# Patient Record
Sex: Male | Born: 1990 | Race: White | Hispanic: No | Marital: Single | Smoking: Never smoker
Health system: Southern US, Community
[De-identification: ages and names within clinical notes are randomized; demographics above are authoritative.]

## PROBLEM LIST (undated history)

## (undated) HISTORY — PX: WISDOM TOOTH EXTRACTION: SHX21

---
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
## 2012-08-05 ENCOUNTER — Ambulatory Visit (INDEPENDENT_AMBULATORY_CARE_PROVIDER_SITE_OTHER): Payer: BC Managed Care – PPO | Admitting: Family Medicine

## 2012-08-05 ENCOUNTER — Encounter: Payer: Self-pay | Admitting: Family Medicine

## 2012-08-05 VITALS — BP 104/76 | Wt 150.2 lb

## 2012-08-05 DIAGNOSIS — J329 Chronic sinusitis, unspecified: Secondary | ICD-10-CM | POA: Insufficient documentation

## 2012-08-05 DIAGNOSIS — J322 Chronic ethmoidal sinusitis: Secondary | ICD-10-CM

## 2012-08-05 MED ORDER — CLINDAMYCIN HCL 150 MG PO CAPS: 150.0000 mg | ORAL_CAPSULE | Freq: Four times a day (QID) | ORAL | Status: AC

## 2012-08-05 NOTE — Progress Notes (Signed)
  Subjective:    Patient ID: Steve Lucas, male    DOB: 09-28-1990, 22 y.o.   MRN: [id number]  Sinusitis This is a recurrent problem. The current episode started in the past 7 days. The problem has been gradually worsening since onset. His pain is at a severity of 3/10. Associated symptoms include coughing, headaches and a hoarse voice. Pertinent negatives include no shortness of breath. Past treatments include oral decongestants. The treatment provided mild relief.  Headache  Associated symptoms include coughing.  Sore Throat  Associated symptoms include coughing, headaches and a hoarse voice. Pertinent negatives include no shortness of breath.      Review of Systems  Constitutional: Negative for activity change and fatigue.  HENT: Positive for hoarse voice.   Respiratory: Positive for cough and wheezing. Negative for apnea and shortness of breath.   Neurological: Positive for headaches.  All other systems reviewed and are negative.       Objective:   Physical Exam  Vitals reviewed. Constitutional: He appears well-developed.  HENT:  Head: Normocephalic and atraumatic.  Nasal congestion evident. Frontal tenderness. Maxillary tenderness.  Eyes: Conjunctivae are normal. Pupils are equal, round, and reactive to light.  Neck: Normal range of motion. No thyromegaly present.  Cardiovascular: Normal rate and regular rhythm.   Pulmonary/Chest: Effort normal. He has no wheezes.  Abdominal: Soft.          Assessment & Plan:  Impression acute flare of sinusitis. Minimally responsive to Levaquin. Plan clindamycin 150 4 times a day. As per orders.

## 2012-08-05 NOTE — Patient Instructions (Signed)
Use probioticdaily while on antibiotics.

## 2012-08-16 ENCOUNTER — Telehealth: Payer: Self-pay | Admitting: *Deleted

## 2012-08-16 ENCOUNTER — Other Ambulatory Visit: Payer: Self-pay | Admitting: *Deleted

## 2012-08-16 DIAGNOSIS — J329 Chronic sinusitis, unspecified: Secondary | ICD-10-CM

## 2012-08-16 NOTE — Telephone Encounter (Signed)
i am not familiar with Dr.Clinger. Dr.Teoh from [facility] or Dr.Rosen are ones I am more familiar with and therefore trust more.we can change appt if family desires

## 2012-08-16 NOTE — Telephone Encounter (Signed)
Has appointment with Dr. Imogene Burn) next week- wanted Dr. Gerilyn Pilgrim but she no loner practicing-concerned if he is highly rec by you or if it was just made by that office. Concerned because mother feels he will need surgery and wants someone you highly recommend.

## 2012-08-21 ENCOUNTER — Telehealth: Payer: Self-pay | Admitting: Family Medicine

## 2012-08-21 ENCOUNTER — Other Ambulatory Visit: Payer: Self-pay | Admitting: Family Medicine

## 2012-08-21 MED ORDER — AMOXICILLIN-POT CLAVULANATE ER 1000-62.5 MG PO TB12: 2.0000 | ORAL_TABLET | Freq: Two times a day (BID) | ORAL | Status: AC

## 2012-08-21 NOTE — Telephone Encounter (Signed)
i spoke with Dad, Augmentin XR 1,000 mg 2 tab bid called into [facility]. Dad wondering when referal with Dr.Rosen is? PLZ CALL HIM WITH UPDATE. THANKS  [phone]

## 2012-08-21 NOTE — Telephone Encounter (Signed)
Patients father is concerned about his ongoing sinus infection and would like to know if he should be on a low dose sinus infection until he sees the ENT? [facility]

## 2012-08-22 NOTE — Telephone Encounter (Signed)
cld dad, gave info, also notified we MUST have signed release from pt to discuss his care cause he's 22 years old

## 2012-10-03 ENCOUNTER — Ambulatory Visit (INDEPENDENT_AMBULATORY_CARE_PROVIDER_SITE_OTHER): Payer: BC Managed Care – PPO | Admitting: Family Medicine

## 2012-10-03 ENCOUNTER — Encounter: Payer: Self-pay | Admitting: Family Medicine

## 2012-10-03 VITALS — BP 110/72 | Temp 98.6°F | Wt 145.0 lb

## 2012-10-03 DIAGNOSIS — R197 Diarrhea, unspecified: Secondary | ICD-10-CM

## 2012-10-03 MED ORDER — DIPHENOXYLATE-ATROPINE 2.5-0.025 MG PO TABS: 1.0000 | ORAL_TABLET | Freq: Four times a day (QID) | ORAL | Status: DC | PRN

## 2012-10-03 MED ORDER — METRONIDAZOLE 500 MG PO TABS: 500.0000 mg | ORAL_TABLET | Freq: Three times a day (TID) | ORAL | Status: DC

## 2012-10-03 MED ORDER — HYOSCYAMINE SULFATE 0.125 MG SL SUBL: 0.1250 mg | SUBLINGUAL_TABLET | SUBLINGUAL | Status: DC | PRN

## 2012-10-03 NOTE — Patient Instructions (Signed)
Also use probiotic one daily [facility] )  If bloody or mucous stools then use Flagyl one 3 times a day  If not well by Tuesday let me know

## 2012-10-03 NOTE — Progress Notes (Signed)
  Subjective:    Patient ID: Steve Lucas, male    DOB: Jan 30, 1991, 22 y.o.   MRN: [id number]  Diarrhea  This is a new problem. The current episode started in the past 7 days. The problem occurs more than 10 times per day. The problem has been unchanged. The patient states that diarrhea awakens him from sleep. Associated symptoms include abdominal pain, bloating, a fever and vomiting. Treatments tried: immodium. The treatment provided no relief.   Patient has been on antibiotics several times in the past few months due to sinus problems Family history noncontributory no other family sickness Social doesn't smoke   Review of Systems  Constitutional: Positive for fever.  Gastrointestinal: Positive for vomiting, abdominal pain, diarrhea and bloating.   Denies bloody stools or mucus in the stool.    Objective:   Physical Exam Vital signs are stable Lungs are clear no crackles heart regular no murmurs Abdomen is soft no guarding rebound or tenderness.       Assessment & Plan:  Abdominal cramps with diarrhea-Lomotil 3 times a day when necessary plus also Levsin SL as necessary if bloody stools or mucus stools start Flagyl 500 mg 3 times a day for 7 days prescription was given for him to hold onto. In addition to this sublingual Levsin should help. Also probiotic daily

## 2013-09-01 ENCOUNTER — Ambulatory Visit (INDEPENDENT_AMBULATORY_CARE_PROVIDER_SITE_OTHER): Payer: BC Managed Care – PPO | Admitting: Family Medicine

## 2013-09-01 ENCOUNTER — Encounter: Payer: Self-pay | Admitting: Family Medicine

## 2013-09-01 VITALS — BP 112/72 | Temp 98.9°F | Ht 68.0 in | Wt 154.0 lb

## 2013-09-01 DIAGNOSIS — J309 Allergic rhinitis, unspecified: Secondary | ICD-10-CM

## 2013-09-01 DIAGNOSIS — J019 Acute sinusitis, unspecified: Secondary | ICD-10-CM

## 2013-09-01 MED ORDER — METHYLPREDNISOLONE ACETATE 40 MG/ML IJ SUSP
40.0000 mg | Freq: Once | INTRAMUSCULAR | Status: AC
  Administered 2013-09-01: 40 mg via INTRAMUSCULAR

## 2013-09-01 MED ORDER — AMOXICILLIN-POT CLAVULANATE ER 1000-62.5 MG PO TB12
2.0000 | ORAL_TABLET | Freq: Two times a day (BID) | ORAL | Status: DC
Start: 2013-09-01 — End: 2014-02-19

## 2013-09-01 NOTE — Progress Notes (Signed)
   Subjective:    Patient ID: Steve LucksMatthew Amaker, male    DOB: 07/25/1990, 23 y.o.   MRN: [id number]  Sinus Problem This is a new problem. The current episode started in the past 7 days. Associated symptoms include congestion and coughing. Pertinent negatives include no ear pain. Past treatments include oral decongestants.   currently using Flonase and generic Allegra PMH benign sinus related issues extensive also allergies   Review of Systems  Constitutional: Negative for fever and activity change.  HENT: Positive for congestion and rhinorrhea. Negative for ear pain.   Eyes: Negative for discharge.  Respiratory: Positive for cough. Negative for wheezing.   Cardiovascular: Negative for chest pain.       Objective:   Physical Exam  Nursing note and vitals reviewed. Constitutional: He appears well-developed.  HENT:  Head: Normocephalic.  Mouth/Throat: Oropharynx is clear and moist. No oropharyngeal exudate.  Neck: Normal range of motion.  Cardiovascular: Normal rate, regular rhythm and normal heart sounds.   No murmur heard. Pulmonary/Chest: Effort normal and breath sounds normal. He has no wheezes.  Lymphadenopathy:    He has no cervical adenopathy.  Neurological: He exhibits normal muscle tone.  Skin: Skin is warm and dry.          Assessment & Plan:  Acute sinusitis-Augmentin extended release thousand milligrams 2 in morning 2 in the evening for 10 days. Call if ongoing troubles  Allergic rhinitis Depo-Medrol shot as directed

## 2013-09-16 ENCOUNTER — Telehealth: Payer: Self-pay | Admitting: Family Medicine

## 2013-09-16 ENCOUNTER — Other Ambulatory Visit: Payer: Self-pay | Admitting: Family Medicine

## 2013-09-16 DIAGNOSIS — Z9109 Other allergy status, other than to drugs and biological substances: Secondary | ICD-10-CM

## 2013-09-16 MED ORDER — LEVOFLOXACIN 500 MG PO TABS: 500.0000 mg | ORAL_TABLET | Freq: Every day | ORAL | Status: DC

## 2013-09-16 NOTE — Telephone Encounter (Signed)
Pt would like a referrral to an allergist. He doesn't know any. Would like to see whoever you recommend.

## 2013-09-16 NOTE — Telephone Encounter (Signed)
Cough, congestion, runny nose, sore throat, some wheezing but inhaler is helping. No fever

## 2013-09-16 NOTE — Telephone Encounter (Signed)
meds sent to pharm. Pt notified.  

## 2013-09-16 NOTE — Telephone Encounter (Signed)
levaquin 500 10 days, continue OTC allergy meds as well, use inhaler as needed, follow up if worse

## 2013-09-16 NOTE — Telephone Encounter (Signed)
Please let the patient know that we're setting him up with the allergist in [place]. Enid DerryBrendale will assist with making this appointment and should notify him in the short future.

## 2013-09-16 NOTE — Telephone Encounter (Signed)
Patient is still having congestion, cough and would like another antibiotic called in. He was just seen on 09/01/13.  [facility] 

## 2013-09-17 NOTE — Telephone Encounter (Signed)
Notified patient that we're setting him up with [place] allerg[place]. Steve Lucas will assist with making this appointment and should notify him in the short future. Patient verbalized understanding.

## 2013-10-17 ENCOUNTER — Encounter: Payer: Self-pay | Admitting: Family Medicine

## 2014-02-10 ENCOUNTER — Encounter: Payer: Self-pay | Admitting: Family Medicine

## 2014-02-10 ENCOUNTER — Ambulatory Visit (INDEPENDENT_AMBULATORY_CARE_PROVIDER_SITE_OTHER): Payer: BC Managed Care – PPO | Admitting: Family Medicine

## 2014-02-10 VITALS — BP 110/70 | Ht 68.0 in | Wt 153.3 lb

## 2014-02-10 DIAGNOSIS — J0110 Acute frontal sinusitis, unspecified: Secondary | ICD-10-CM

## 2014-02-10 DIAGNOSIS — J0111 Acute recurrent frontal sinusitis: Secondary | ICD-10-CM

## 2014-02-10 MED ORDER — LEVOFLOXACIN 500 MG PO TABS: 500.0000 mg | ORAL_TABLET | Freq: Every day | ORAL | Status: DC

## 2014-02-10 NOTE — Progress Notes (Signed)
   Subjective:    Patient ID: Steve Lucas, male    DOB: 06/13/1990, 23 y.o.   MRN: [id number]  Sinusitis Associated symptoms include congestion and coughing. Pertinent negatives include no ear pain.   He is related moderate sinus symptoms drainage sinus pressure pain discomfort He has seen the ear nose throat doctor as well as the allergist.   Review of Systems  Constitutional: Negative for fever and activity change.  HENT: Positive for congestion and rhinorrhea. Negative for ear pain.   Eyes: Negative for discharge.  Respiratory: Positive for cough. Negative for wheezing.   Cardiovascular: Negative for chest pain.       Objective:   Physical Exam  Nursing note and vitals reviewed. Constitutional: He appears well-developed.  HENT:  Head: Normocephalic.  Mouth/Throat: Oropharynx is clear and moist. No oropharyngeal exudate.  Neck: Normal range of motion.  Cardiovascular: Normal rate, regular rhythm and normal heart sounds.   No murmur heard. Pulmonary/Chest: Effort normal and breath sounds normal. He has no wheezes.  Lymphadenopathy:    He has no cervical adenopathy.  Neurological: He exhibits normal muscle tone.  Skin: Skin is warm and dry.          Assessment & Plan:  Acute sinusitis Levaquin over the next couple weeks. If not improving I would recommend that he go back to seeing the ear nose throat doctor. Patient states surgery as a potential option but he did rather not. He denies any complications currently will followup if ongoing troubles  He will sign a release today to get copies of his CAT scan sent to us for review

## 2014-02-19 ENCOUNTER — Ambulatory Visit (INDEPENDENT_AMBULATORY_CARE_PROVIDER_SITE_OTHER): Payer: BC Managed Care – PPO | Admitting: Family Medicine

## 2014-02-19 ENCOUNTER — Encounter: Payer: Self-pay | Admitting: Family Medicine

## 2014-02-19 VITALS — BP 108/72 | Ht 68.0 in | Wt 151.0 lb

## 2014-02-19 DIAGNOSIS — J301 Allergic rhinitis due to pollen: Secondary | ICD-10-CM

## 2014-02-19 DIAGNOSIS — J0111 Acute recurrent frontal sinusitis: Secondary | ICD-10-CM

## 2014-02-19 MED ORDER — CEFDINIR 300 MG PO CAPS: 300.0000 mg | ORAL_CAPSULE | Freq: Two times a day (BID) | ORAL | Status: DC

## 2014-02-19 MED ORDER — PREDNISONE 20 MG PO TABS
ORAL_TABLET | ORAL | Status: DC
Start: 2014-02-19 — End: 2014-04-03

## 2014-02-19 NOTE — Progress Notes (Signed)
   Subjective:    Patient ID: Steve Lucas, male    DOB: 08/15/1990, 23 y.o.   MRN: [id number]  HPI  Patient still has drainage in throat  &  Congestion with color phlegm.  And sinus problems. The CAT scans that were done at [place] showed sinusitis they did not do the endoscopic examination. 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 We will set him up with specialist that [employer].  Review of Systems  Constitutional: Negative for fever and activity change.  HENT: Positive for congestion and rhinorrhea. Negative for ear pain.   Eyes: Negative for discharge.  Respiratory: Positive for cough. Negative for wheezing.   Cardiovascular: Negative for chest pain.       Objective:   Physical Exam  Nursing note and vitals reviewed. Constitutional: He appears well-developed.  HENT:  Head: Normocephalic.  Mouth/Throat: Oropharynx is clear and moist. No oropharyngeal exudate.  Neck: Normal range of motion.  Cardiovascular: Normal rate, regular rhythm and normal heart sounds.   No murmur heard. Pulmonary/Chest: Effort normal and breath sounds normal. He has no wheezes.  Lymphadenopathy:    He has no cervical adenopathy.  Neurological: He exhibits normal muscle tone.  Skin: Skin is warm and dry.          Assessment & Plan:  Antibiotics prescribe it if he doesn't have to take hold off on these Prednisone prescribed taper over the next 9 days should gradually get better Referral to ENT at [facility]. Patient needs endoscopy of the nose possible surgery

## 2014-02-24 ENCOUNTER — Encounter: Payer: Self-pay | Admitting: Family Medicine

## 2014-04-03 ENCOUNTER — Telehealth: Payer: Self-pay | Admitting: Family Medicine

## 2014-04-03 MED ORDER — AMOXICILLIN-POT CLAVULANATE 875-125 MG PO TABS: 1.0000 | ORAL_TABLET | Freq: Two times a day (BID) | ORAL | Status: AC

## 2014-04-03 MED ORDER — PREDNISONE 20 MG PO TABS: ORAL_TABLET | ORAL | Status: DC

## 2014-04-03 NOTE — Telephone Encounter (Signed)
Patient calls today and thinks he may a sinus infection and is requesting prednisone.  He is having sinus surgery on 04/16/14.  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  He has colored drainage, sneezing, runny nose and he says the usual symptoms that comes along with a sinus infection.  He said he is not able to come in the office today.   [facility]

## 2014-04-03 NOTE — Telephone Encounter (Signed)
Adult pred taper and aug 875 bid ten d

## 2014-04-03 NOTE — Telephone Encounter (Signed)
Rxs sent electronically to pharmacy. Patient notified. 

## 2014-05-19 ENCOUNTER — Encounter: Payer: Self-pay | Admitting: Family Medicine

## 2014-05-19 ENCOUNTER — Ambulatory Visit (INDEPENDENT_AMBULATORY_CARE_PROVIDER_SITE_OTHER): Payer: BC Managed Care – PPO | Admitting: Family Medicine

## 2014-05-19 VITALS — BP 106/70 | Temp 97.9°F | Ht 68.0 in | Wt 155.6 lb

## 2014-05-19 DIAGNOSIS — J329 Chronic sinusitis, unspecified: Secondary | ICD-10-CM

## 2014-05-19 MED ORDER — LEVOFLOXACIN 500 MG PO TABS: 500.0000 mg | ORAL_TABLET | Freq: Every day | ORAL | Status: DC

## 2014-05-19 NOTE — Progress Notes (Signed)
   Subjective:    Patient ID: Jenel LucksMatthew Schuermann, male    DOB: 11/03/1990, 24 y.o.   MRN: [id number]  Cough This is a new problem. The current episode started yesterday. Associated symptoms include wheezing. Exacerbated by: recent sinus surgery.    Dr Apolinar Junesbrandon Crosby Oysterjang had dev septum and ostial surgery,,  A couple days ago, started with coughing and congestion in thechest  occas productive  Using the albuterol for wheezing tends to stir up with infxn  No sig nasal presssure or throat  Review of Systems  Respiratory: Positive for cough and wheezing.    No vomiting no diarrhea no rash ROS otherwise negative    Objective:   Physical Exam Alert mild malaise. HEENT mild nasal congestion pharynx some drainage neck supple. Lungs bronchial cough no true wheezes heart regular in rhythm.       Assessment & Plan:  Impression rhinosinusitis/bronchitis with element of reactive airways plan albuterol when necessary. Warning signs discussed. Antibiotics prescribed. WSL

## 2014-06-11 ENCOUNTER — Ambulatory Visit (INDEPENDENT_AMBULATORY_CARE_PROVIDER_SITE_OTHER): Payer: BC Managed Care – PPO | Admitting: Family Medicine

## 2014-06-11 VITALS — BP 110/74 | Temp 99.1°F | Ht 68.0 in | Wt 158.0 lb

## 2014-06-11 DIAGNOSIS — J0111 Acute recurrent frontal sinusitis: Secondary | ICD-10-CM

## 2014-06-11 MED ORDER — AMOXICILLIN-POT CLAVULANATE ER 1000-62.5 MG PO TB12: 2.0000 | ORAL_TABLET | Freq: Two times a day (BID) | ORAL | Status: DC

## 2014-06-11 NOTE — Progress Notes (Signed)
   Subjective:    Patient ID: Steve Lucas, male    DOB: 12/27/1990, 24 y.o.   MRN: [id number]  Cough This is a new problem. The current episode started 1 to 4 weeks ago. Associated symptoms include ear pain, a fever, headaches, nasal congestion, rhinorrhea and a sore throat. Pertinent negatives include no chest pain or wheezing. Treatments tried: levaquin. The treatment provided no relief.      Review of Systems  Constitutional: Positive for fever. Negative for activity change.  HENT: Positive for congestion, ear pain, rhinorrhea and sore throat.   Eyes: Negative for discharge.  Respiratory: Positive for cough. Negative for wheezing.   Cardiovascular: Negative for chest pain.  Neurological: Positive for headaches.       Objective:   Physical Exam  Constitutional: He appears well-developed.  HENT:  Head: Normocephalic.  Mouth/Throat: Oropharynx is clear and moist. No oropharyngeal exudate.  Neck: Normal range of motion.  Cardiovascular: Normal rate, regular rhythm and normal heart sounds.   No murmur heard. Pulmonary/Chest: Effort normal and breath sounds normal. He has no wheezes.  Lymphadenopathy:    He has no cervical adenopathy.  Neurological: He exhibits normal muscle tone.  Skin: Skin is warm and dry.  Nursing note and vitals reviewed.         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 Antibiotics prescribed

## 2014-06-22 ENCOUNTER — Encounter: Payer: Self-pay | Admitting: Family Medicine

## 2014-06-22 MED ORDER — AMOXICILLIN-POT CLAVULANATE ER 1000-62.5 MG PO TB12: 2.0000 | ORAL_TABLET | Freq: Two times a day (BID) | ORAL | Status: AC

## 2014-06-22 NOTE — Addendum Note (Signed)
Addended by: Margaretha SheffieldBROWN, Cherica Heiden S on: 06/22/2014 02:34 PM   Modules accepted: Orders, Medications

## 2014-06-22 NOTE — Telephone Encounter (Signed)
Let pt know : may refill med,please send in rf, follow up if problems

## 2014-10-05 ENCOUNTER — Encounter: Payer: Self-pay | Admitting: Family Medicine

## 2014-10-05 ENCOUNTER — Ambulatory Visit (INDEPENDENT_AMBULATORY_CARE_PROVIDER_SITE_OTHER): Payer: BC Managed Care – PPO | Admitting: Family Medicine

## 2014-10-05 VITALS — Temp 98.2°F | Ht 68.0 in | Wt 157.4 lb

## 2014-10-05 DIAGNOSIS — J302 Other seasonal allergic rhinitis: Secondary | ICD-10-CM

## 2014-10-05 DIAGNOSIS — J329 Chronic sinusitis, unspecified: Secondary | ICD-10-CM

## 2014-10-05 MED ORDER — AZELASTINE-FLUTICASONE 137-50 MCG/ACT NA SUSP: NASAL | Status: DC

## 2014-10-05 MED ORDER — METHYLPREDNISOLONE ACETATE 40 MG/ML IJ SUSP
40.0000 mg | Freq: Once | INTRAMUSCULAR | Status: AC
  Administered 2014-10-05: 40 mg via INTRAMUSCULAR

## 2014-10-05 NOTE — Progress Notes (Signed)
   Subjective:    Patient ID: Steve Lucas, male    DOB: 01/13/1991, 24 y.o.   MRN: [id number]  HPI  Patient arrives with complaint of continues sinus sx since since surgery. Patient with chronic sinus pressure drainage coughing denies wheezing or difficulty breathing. Has seen allergist who did some testing and told him he was allergic tree pollens but did not recommend shots has seen the deep specialist multiple times they might be doing a CAT scan in the future  Review of Systems  Constitutional: Negative for fever and activity change.  HENT: Positive for congestion and rhinorrhea. Negative for ear pain.   Eyes: Negative for discharge.  Respiratory: Positive for cough. Negative for wheezing.   Cardiovascular: Negative for chest pain.       Objective:   Physical Exam  Constitutional: He appears well-developed.  HENT:  Head: Normocephalic.  Mouth/Throat: Oropharynx is clear and moist. No oropharyngeal exudate.  Neck: Normal range of motion.  Cardiovascular: Normal rate, regular rhythm and normal heart sounds.   No murmur heard. Pulmonary/Chest: Effort normal and breath sounds normal. He has no wheezes.  Lymphadenopathy:    He has no cervical adenopathy.  Neurological: He exhibits normal muscle tone.  Skin: Skin is warm and dry.  Nursing note and vitals reviewed.         Assessment & Plan:  Allergic rhinitis Depo-Medrol allergy medications also consider Dymista nasal spray prescription was written and given to him. He will look into reducing the co-pay.  Chronic [facility] being [facility] by [facility] with Z[facility]. If progressive troubles or problems he may call us if we need to do an additional anabiotic. I would recommend Augmentin if so.

## 2014-10-16 ENCOUNTER — Other Ambulatory Visit: Payer: Self-pay | Admitting: *Deleted

## 2014-10-16 MED ORDER — AZELASTINE-FLUTICASONE 137-50 MCG/ACT NA SUSP: NASAL | Status: DC

## 2014-10-22 ENCOUNTER — Telehealth: Payer: Self-pay | Admitting: Family Medicine

## 2014-10-22 NOTE — Telephone Encounter (Signed)
Rx prior auth APPROVED for pt's Azelastine-Fluticasone [facility]) 137-50 MCG/ACT SUSP, case ID# [id number] expires 10/22/15 through [employer], faxed approval to [facility]

## 2015-01-12 ENCOUNTER — Encounter: Payer: Self-pay | Admitting: Family Medicine

## 2015-01-12 ENCOUNTER — Ambulatory Visit (INDEPENDENT_AMBULATORY_CARE_PROVIDER_SITE_OTHER): Payer: BC Managed Care – PPO | Admitting: Family Medicine

## 2015-01-12 VITALS — BP 116/70 | Temp 98.5°F | Ht 68.0 in | Wt 161.4 lb

## 2015-01-12 DIAGNOSIS — L259 Unspecified contact dermatitis, unspecified cause: Secondary | ICD-10-CM

## 2015-01-12 MED ORDER — METHYLPREDNISOLONE ACETATE 40 MG/ML IJ SUSP
40.0000 mg | Freq: Once | INTRAMUSCULAR | Status: AC
  Administered 2015-01-12: 40 mg via INTRAMUSCULAR

## 2015-01-12 MED ORDER — PREDNISONE 20 MG PO TABS: ORAL_TABLET | ORAL | Status: DC

## 2015-01-12 NOTE — Progress Notes (Signed)
   Subjective:    Patient ID: Steve Lucas, male    DOB: 1990-06-14, 24 y.o.   MRN: [id number]  Rash This is a new problem. The current episode started in the past 7 days. The problem has been gradually worsening since onset. The affected locations include the left arm and right arm. The rash is characterized by redness, itchiness, dryness and blistering. He was exposed to an insect bite/sting. Treatments tried: Benadryl oral, benadryl cream. The treatment provided no relief.   the patient thinks that it may be coming from the lows he sleeps but I informed the patient that the visual inspection of this points more toward a contact dermatitis from outside allergen  Patient states no other concerns this visit.  Review of Systems  Skin: Positive for rash.       Objective:   Physical Exam  Significant contact dermatitis left forearm on the exterior aspect plus also both forearms on the inner aspect      Assessment & Plan:  Recommended shot of steroid is tapered Benadryl when necessary caution drowsiness ongoing troubles follow-up

## 2015-03-11 ENCOUNTER — Encounter: Payer: Self-pay | Admitting: Family Medicine

## 2015-03-11 ENCOUNTER — Ambulatory Visit (INDEPENDENT_AMBULATORY_CARE_PROVIDER_SITE_OTHER): Payer: BC Managed Care – PPO | Admitting: Family Medicine

## 2015-03-11 VITALS — BP 112/80 | Temp 97.7°F | Ht 68.0 in | Wt 158.1 lb

## 2015-03-11 DIAGNOSIS — J0111 Acute recurrent frontal sinusitis: Secondary | ICD-10-CM | POA: Diagnosis not present

## 2015-03-11 MED ORDER — AMOXICILLIN-POT CLAVULANATE ER 1000-62.5 MG PO TB12
2.0000 | ORAL_TABLET | Freq: Two times a day (BID) | ORAL | Status: DC
Start: 2015-03-11 — End: 2015-03-22

## 2015-03-11 NOTE — Progress Notes (Signed)
   Subjective:    Patient ID: Steve Lucas, male    DOB: 09/04/1990, 24 y.o.   MRN: [id number]  Sinusitis This is a new problem. The current episode started in the past 7 days. Associated symptoms include congestion, coughing and sinus pressure. Pertinent negatives include no ear pain. (Runny nose,) Treatments tried: Dymista. The treatment provided no relief.   Patient states no other concerns this visit. 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  HENT: Positive for congestion, rhinorrhea and sinus pressure. Negative for ear pain.   Eyes: Negative for discharge.  Respiratory: Positive for cough. Negative for wheezing.   Cardiovascular: Negative for chest pain.       Objective:   Physical Exam  Constitutional: He appears well-developed.  HENT:  Head: Normocephalic.  Mouth/Throat: Oropharynx is clear and moist. No oropharyngeal exudate.  Neck: Normal range of motion.  Cardiovascular: Normal rate, regular rhythm and normal heart sounds.   No murmur heard. Pulmonary/Chest: Effort normal and breath sounds normal. He has no wheezes.  Lymphadenopathy:    He has no cervical adenopathy.  Neurological: He exhibits normal muscle tone.  Skin: Skin is warm and dry.  Nursing note and vitals reviewed.         Assessment & Plan:  Reoccurring sinusitis Augmentin 10 days as directed warning signs discussed follow-up if problems recheck if any issues continue allergy medicines.

## 2015-03-22 ENCOUNTER — Telehealth: Payer: Self-pay | Admitting: Family Medicine

## 2015-03-22 MED ORDER — AMOXICILLIN-POT CLAVULANATE ER 1000-62.5 MG PO TB12: 2.0000 | ORAL_TABLET | Freq: Two times a day (BID) | ORAL | Status: AC

## 2015-03-22 NOTE — Telephone Encounter (Signed)
Pt not much better, wants a refill on amoxicillin-clavulanate (AUGMENTIN XR) 1000-62.5 MG 12 hr tablet Pt states last time we ordered this that we had to change to 500mg  due to no pharmacy having the 1000mg  in stock  Last seen 03/11/15  Please advise & call pt when done   [facility]

## 2015-03-22 NOTE — Telephone Encounter (Signed)
Try 1000 but if on back order then 875 one bid 10 days of Augmentin

## 2015-03-22 NOTE — Telephone Encounter (Addendum)
augmentin 1000 mg not available at this time per pharmacy- he was switched to 875 mg- 1000 mg still not available

## 2015-03-22 NOTE — Telephone Encounter (Signed)
Rx sent electronically to pharmacy. Patient notified. 

## 2015-04-20 ENCOUNTER — Ambulatory Visit (INDEPENDENT_AMBULATORY_CARE_PROVIDER_SITE_OTHER): Payer: BC Managed Care – PPO | Admitting: Family Medicine

## 2015-04-20 ENCOUNTER — Encounter: Payer: Self-pay | Admitting: Family Medicine

## 2015-04-20 VITALS — BP 110/74 | Temp 98.1°F | Ht 68.0 in | Wt 154.0 lb

## 2015-04-20 DIAGNOSIS — K130 Diseases of lips: Secondary | ICD-10-CM

## 2015-04-20 MED ORDER — VALACYCLOVIR HCL 1 G PO TABS
ORAL_TABLET | ORAL | Status: DC
Start: 2015-04-20 — End: 2015-06-17

## 2015-04-20 MED ORDER — KETOCONAZOLE 2 % EX CREA: 1.0000 "application " | TOPICAL_CREAM | Freq: Two times a day (BID) | CUTANEOUS | Status: DC | PRN

## 2015-04-20 NOTE — Progress Notes (Signed)
   Subjective:    Patient ID: Steve Lucas, male    DOB: 04/04/1991, 24 y.o.   MRN: [id number]  HPIcold sore for 4 days. Has tried chapstick and clove oil.  Patient denies having history of this before the corner of the mouth is bothering him to some degree denies high fever chills denies vomiting diarrhea or headache   Review of Systems     Objective:   Physical Exam  There is little bit of crusting in the corner of the mouth along with some inflammation in addition to this a small blister on the right lower lip.      Assessment & Plan:  There is a possibility that this could be a small fever blister it's hard to tell I would recommend Valtrex if it reoccurs that obviously definitely is the case Angular chelitis-antifungal cream follow-up if progressive troubles

## 2015-06-09 ENCOUNTER — Ambulatory Visit (INDEPENDENT_AMBULATORY_CARE_PROVIDER_SITE_OTHER): Payer: BC Managed Care – PPO | Admitting: Family Medicine

## 2015-06-09 ENCOUNTER — Encounter: Payer: Self-pay | Admitting: Family Medicine

## 2015-06-09 VITALS — Temp 98.3°F | Ht 68.0 in | Wt 153.8 lb

## 2015-06-09 DIAGNOSIS — J0111 Acute recurrent frontal sinusitis: Secondary | ICD-10-CM

## 2015-06-09 MED ORDER — PREDNISONE 20 MG PO TABS: ORAL_TABLET | ORAL | Status: DC

## 2015-06-09 MED ORDER — AMOXICILLIN-POT CLAVULANATE 875-125 MG PO TABS: 1.0000 | ORAL_TABLET | Freq: Two times a day (BID) | ORAL | Status: DC

## 2015-06-09 MED ORDER — AZELASTINE-FLUTICASONE 137-50 MCG/ACT NA SUSP: NASAL | Status: DC

## 2015-06-09 NOTE — Progress Notes (Signed)
   Subjective:    Patient ID: Steve Lucas, male    DOB: May 26, 1990, 25 y.o.   MRN: [id number]  Cough This is a new problem. The current episode started in the past 7 days. Associated symptoms include ear pain, headaches, nasal congestion and a sore throat. Treatments tried: sudafed and decongestant.     Patient with significant head congestion sinus pressure not feeling good over the past week and half. Sinus pressure pain discomfort some hoarseness  Review of Systems  HENT: Positive for ear pain and sore throat.   Respiratory: Positive for cough.   Neurological: Positive for headaches.       Objective:   Physical Exam  Constitutional: He appears well-developed.  HENT:  Head: Normocephalic.  Mouth/Throat: Oropharynx is clear and moist. No oropharyngeal exudate.  Neck: Normal range of motion.  Cardiovascular: Normal rate, regular rhythm and normal heart sounds.   No murmur heard. Pulmonary/Chest: Effort normal and breath sounds normal. He has no wheezes.  Lymphadenopathy:    He has no cervical adenopathy.  Neurological: He exhibits normal muscle tone.  Skin: Skin is warm and dry.  Nursing note and vitals reviewed.         Assessment & Plan:  Patient was seen today for upper respiratory illness. It is felt that the patient is dealing with sinusitis. Antibiotics were prescribed today. Importance of compliance with medication was discussed. Symptoms should gradually resolve over the course of the next several days. If high fevers, progressive illness, difficulty breathing, worsening condition or failure for symptoms to improve over the next several days then the patient is to follow-up. If any emergent conditions the patient is to follow-up in the emergency department otherwise to follow-up in the office.

## 2015-06-17 ENCOUNTER — Ambulatory Visit (INDEPENDENT_AMBULATORY_CARE_PROVIDER_SITE_OTHER): Payer: BC Managed Care – PPO | Admitting: Family Medicine

## 2015-06-17 ENCOUNTER — Encounter: Payer: Self-pay | Admitting: Family Medicine

## 2015-06-17 VITALS — BP 118/74 | Temp 98.7°F | Ht 68.0 in | Wt 156.0 lb

## 2015-06-17 DIAGNOSIS — J019 Acute sinusitis, unspecified: Secondary | ICD-10-CM | POA: Diagnosis not present

## 2015-06-17 DIAGNOSIS — B9689 Other specified bacterial agents as the cause of diseases classified elsewhere: Secondary | ICD-10-CM

## 2015-06-17 MED ORDER — DOXYCYCLINE HYCLATE 100 MG PO CAPS
100.0000 mg | ORAL_CAPSULE | Freq: Two times a day (BID) | ORAL | Status: DC
Start: 2015-06-17 — End: 2015-06-29

## 2015-06-17 NOTE — Progress Notes (Signed)
   Subjective:    Patient ID: Steve Lucas, male    DOB: [birth year], 25 y.o.   MRN: [id number]  Cough This is a new problem. Episode onset: 2 weeks  Associated symptoms include ear pain and nasal congestion. Treatments tried: augmentin, prednisone.   Patient with frequent sinus issues worse over the past few days prednisone Augmentin helped a little but not enough   Review of Systems  HENT: Positive for ear pain.   Respiratory: Positive for cough.    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

## 2015-06-29 ENCOUNTER — Telehealth: Payer: Self-pay | Admitting: Family Medicine

## 2015-06-29 MED ORDER — DOXYCYCLINE HYCLATE 100 MG PO CAPS: 100.0000 mg | ORAL_CAPSULE | Freq: Two times a day (BID) | ORAL | Status: DC

## 2015-06-29 NOTE — Telephone Encounter (Signed)
Spoke with patient. Patient stated he is still experiencing nasal congestion with yellow tinged sputum, and non productive cough. Patient states severity of symptoms have decreased since on Doxycycline. Wants to know if we can send in another round of Doxycycline. Please advise?

## 2015-06-29 NOTE — Telephone Encounter (Signed)
Patient requesting Rx for doxycyline.  He was prescribed this for rhinosinusitis on 06/17/15.  He said its working, but not as fast as he would like it to and he is almost out.    [facility]

## 2015-06-29 NOTE — Telephone Encounter (Signed)
Spoke with patient and informed him per Dr.Steve Luking refill was sent over on Doxycycline. Patient verbalized understanding.

## 2015-06-29 NOTE — Telephone Encounter (Signed)
Ok rep doxy

## 2015-08-22 ENCOUNTER — Encounter: Payer: Self-pay | Admitting: Family Medicine

## 2015-08-22 ENCOUNTER — Telehealth: Payer: Self-pay | Admitting: Family Medicine

## 2015-08-22 NOTE — Telephone Encounter (Signed)
Patient will be going [place] in [place]. This patient will need some immunizations and will need to be on medication to help prevent malaria. Please connect with the patient let him know that I have put together information for him. It would be to his advantage to coordinate coming by so I can review with him the recommendations. This can be at his convenience but I would prefer it to be near the end of the morning or near the end of the afternoon so I can spend a couple minutes with him reviewing this information. Obviously he would have to come on a day that I am here.

## 2015-08-23 NOTE — Telephone Encounter (Signed)
Patient coming by 08/23/15 at 11:30-Dr Lorin PicketScott notified

## 2015-11-01 ENCOUNTER — Ambulatory Visit (INDEPENDENT_AMBULATORY_CARE_PROVIDER_SITE_OTHER): Payer: BC Managed Care – PPO | Admitting: Family Medicine

## 2015-11-01 ENCOUNTER — Encounter: Payer: Self-pay | Admitting: Family Medicine

## 2015-11-01 VITALS — BP 102/70 | Temp 98.0°F | Ht 68.0 in | Wt 152.5 lb

## 2015-11-01 DIAGNOSIS — M26629 Arthralgia of temporomandibular joint, unspecified side: Secondary | ICD-10-CM

## 2015-11-01 MED ORDER — ETODOLAC 400 MG PO TABS: 400.0000 mg | ORAL_TABLET | Freq: Two times a day (BID) | ORAL | Status: DC

## 2015-11-01 MED ORDER — CYCLOBENZAPRINE HCL 10 MG PO TABS: 10.0000 mg | ORAL_TABLET | Freq: Every day | ORAL | Status: DC

## 2015-11-01 NOTE — Progress Notes (Signed)
   Subjective:    Patient ID: Steve Lucas, male    DOB: 12/29/1990, 25 y.o.   MRN: [id number]  HPI Patient in today for  jaw pain to left side. Onset last Wednesday. Pain is aggravated by chewing, or yawning. Also has some c/o left ear pain. Has not tried any treatment. Out of school, just finished [employer]  Wed and thur tight in left jaw angle  Pos sig pain in jaw painful and sore  yawnoing at toimes and hurts  no chewing habit   No recent dental work ,[place] thought it was tmj  On and off er pain left side in last two yrs   States no other concerns this visit.   Review of Systems No headache, no major weight loss or weight gain, no chest pain no back pain abdominal pain no change in bowel habits complete ROS otherwise negative     Objective:   Physical Exam Alert vital stable HET TMs normal pharynx normal neck supple no lymphadenopathy positive left TMJ pain with extension internal     Assessment & Plan:   impression TMJ pain plan anti-inflammatory medicine prescribed. Nocturnal muscle spasm medicine prescribed. Avoid heavy chewing symptom care discussed WSL

## 2015-11-03 ENCOUNTER — Other Ambulatory Visit: Payer: Self-pay | Admitting: Family Medicine

## 2015-12-10 ENCOUNTER — Telehealth: Payer: Self-pay | Admitting: Family Medicine

## 2015-12-10 ENCOUNTER — Other Ambulatory Visit: Payer: Self-pay | Admitting: *Deleted

## 2015-12-10 MED ORDER — SULFACETAMIDE SODIUM 10 % OP SOLN: OPHTHALMIC | 0 refills | Status: DC

## 2015-12-10 NOTE — Telephone Encounter (Signed)
No med allergies. Eye is red since yesterday, feels like something in eye, no drainage, pt states he checked eye and there is nothing in it. Consult with dr Lorin Picket. Sulfacetamide opthal ghs 2 gtts qid for 5 days to affected eye. Needs to see eye doctor on Monday if not better. Med sent to pharm. Pt verbalized understanding.

## 2015-12-10 NOTE — Telephone Encounter (Signed)
Possible pink eye 

## 2016-03-06 ENCOUNTER — Encounter: Payer: Self-pay | Admitting: Family Medicine

## 2016-03-06 ENCOUNTER — Ambulatory Visit (INDEPENDENT_AMBULATORY_CARE_PROVIDER_SITE_OTHER): Payer: BC Managed Care – PPO | Admitting: Family Medicine

## 2016-03-06 VITALS — BP 102/66 | Ht 68.0 in | Wt 150.1 lb

## 2016-03-06 DIAGNOSIS — Z0000 Encounter for general adult medical examination without abnormal findings: Secondary | ICD-10-CM

## 2016-03-06 NOTE — Progress Notes (Signed)
   Subjective:    Patient ID: Steve Lucas, male    DOB: 04/10/1991, 25 y.o.   MRN: [id number]  HPI The patient comes in today for a wellness visit.  low-density she   A review of their health history was completed.  A review of medications was also completed.  Any needed refills; None   Eating habits: Patient states eating habits are pretty good.   Falls/  MVA accidents in past few months: None   Regular exercise: Patient states works out 4x a week. He lifts weights, runs, swims.  Specialist pt sees on regular basis: None  Preventative health issues were discussed.   Additional concerns: States no other concerns this visit.    Review of Systems  Constitutional: Negative for activity change, appetite change and fever.  HENT: Negative for congestion and rhinorrhea.   Eyes: Negative for discharge.  Respiratory: Negative for cough and wheezing.   Cardiovascular: Negative for chest pain.  Gastrointestinal: Negative for abdominal pain, blood in stool and vomiting.  Genitourinary: Negative for difficulty urinating and frequency.  Musculoskeletal: Negative for neck pain.  Skin: Negative for rash.  Allergic/Immunologic: Negative for environmental allergies and food allergies.  Neurological: Negative for weakness and headaches.  Psychiatric/Behavioral: Negative for agitation.       Objective:   Physical Exam  Constitutional: He appears well-developed and well-nourished.  HENT:  Head: Normocephalic and atraumatic.  Right Ear: External ear normal.  Left Ear: External ear normal.  Nose: Nose normal.  Mouth/Throat: Oropharynx is clear and moist.  Eyes: EOM are normal. Pupils are equal, round, and reactive to light.  Neck: Normal range of motion. Neck supple. No thyromegaly present.  Cardiovascular: Normal rate, regular rhythm and normal heart sounds.   No murmur heard. Pulmonary/Chest: Effort normal and breath sounds normal. No respiratory distress. He has no wheezes.    Abdominal: Soft. Bowel sounds are normal. He exhibits no distension and no mass. There is no tenderness.  Genitourinary: Penis normal. No penile tenderness.  Musculoskeletal: Normal range of motion. He exhibits no edema.  Lymphadenopathy:    He has no cervical adenopathy.  Neurological: He is alert. He exhibits normal muscle tone.  Skin: Skin is warm and dry. No erythema.  Psychiatric: He has a normal mood and affect. His behavior is normal. Judgment normal.          Assessment & Plan:  Adult wellness-complete.wellness physical was conducted today. Importance of diet and exercise were discussed in detail. In addition to this a discussion regarding safety was also covered. We also reviewed over immunizations and gave recommendations regarding current immunization needed for age. In addition to this additional areas were also touched on including: Preventative health exams needed: Colonoscopy   Not indicated  Prostate exam not indicated  Overall health is good. He is approved for normal activity. 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  Patient was advised yearly wellness exam

## 2016-05-06 ENCOUNTER — Other Ambulatory Visit: Payer: Self-pay | Admitting: Family Medicine

## 2016-05-24 ENCOUNTER — Encounter: Payer: Self-pay | Admitting: Family Medicine

## 2016-05-24 ENCOUNTER — Ambulatory Visit (INDEPENDENT_AMBULATORY_CARE_PROVIDER_SITE_OTHER): Payer: BC Managed Care – PPO | Admitting: Family Medicine

## 2016-05-24 VITALS — BP 112/72 | Temp 97.7°F | Ht 68.0 in | Wt 153.8 lb

## 2016-05-24 DIAGNOSIS — J019 Acute sinusitis, unspecified: Secondary | ICD-10-CM

## 2016-05-24 DIAGNOSIS — B9689 Other specified bacterial agents as the cause of diseases classified elsewhere: Secondary | ICD-10-CM

## 2016-05-24 MED ORDER — AMOXICILLIN-POT CLAVULANATE 875-125 MG PO TABS: 1.0000 | ORAL_TABLET | Freq: Two times a day (BID) | ORAL | 0 refills | Status: DC

## 2016-05-24 NOTE — Progress Notes (Signed)
   Subjective:    Patient ID: Steve Lucas, male    DOB: 05/10/1991, 26 y.o.   MRN: [id number]  Cough  This is a new problem. The current episode started in the past 7 days. Associated symptoms include chills, myalgias and nasal congestion. Associated symptoms comments: Sinus pressure. Treatments tried: otc meds.   Hit this weekend  cpough persistent  Not consistent  Off   Dep achiness in the muscles and joints  Sinus pressure at times  No flu shot      Review of Systems  Constitutional: Positive for chills.  Respiratory: Positive for cough.   Musculoskeletal: Positive for myalgias.       Objective:   Physical Exam  Alert, mild malaise. Hydration good Vitals stable. frontal/ maxillary tenderness evident positive nasal congestion. pharynx normal neck supple  lungs clear/no crackles or wheezes. heart regular in rhythm       Assessment & Plan:  Impression rhinosinusitis likely post viral, In fact likely post flu discussed. At this time flu medicine will not help at all., discussed with patient. plan antibiotics prescribed. Questions answered. Symptomatic care discussed. warning signs discussed. WSL

## 2016-11-18 ENCOUNTER — Other Ambulatory Visit: Payer: Self-pay | Admitting: Family Medicine

## 2016-11-20 NOTE — Telephone Encounter (Signed)
Last seen January 2018 for sick visit.

## 2017-02-19 ENCOUNTER — Ambulatory Visit: Payer: BC Managed Care – PPO | Admitting: Family Medicine

## 2017-03-08 ENCOUNTER — Encounter: Payer: Self-pay | Admitting: Family Medicine

## 2017-03-08 ENCOUNTER — Ambulatory Visit (INDEPENDENT_AMBULATORY_CARE_PROVIDER_SITE_OTHER): Payer: BC Managed Care – PPO | Admitting: Family Medicine

## 2017-03-08 VITALS — BP 110/76 | Ht 68.0 in | Wt 159.2 lb

## 2017-03-08 DIAGNOSIS — J301 Allergic rhinitis due to pollen: Secondary | ICD-10-CM | POA: Diagnosis not present

## 2017-03-08 DIAGNOSIS — M25562 Pain in left knee: Secondary | ICD-10-CM | POA: Diagnosis not present

## 2017-03-08 MED ORDER — TRIAMCINOLONE ACETONIDE 0.1 % EX CREA: TOPICAL_CREAM | CUTANEOUS | 2 refills | Status: DC

## 2017-03-08 MED ORDER — AZELASTINE-FLUTICASONE 137-50 MCG/ACT NA SUSP: NASAL | 12 refills | Status: DC

## 2017-03-08 NOTE — Progress Notes (Signed)
   Subjective:    Patient ID: Steve Lucas, male    DOB: 03/25/1991, 26 y.o.   MRN: [id number]  HPI  Patient in today for refills on Allergy medications.  Uses Dymista nasal spray as needed to help him with allergy related symptoms he needs refills  Also has concerns of rash that comes up on neck periodically.  Never severe.  No pus drainage just itches intermittently  Periodically gets left lateral knee pain when he runs he will last for as long as he runs then sometimes it will be sore for 1-2 days he does not give way does not lock it has been going on for months he is tried not running but every time he tries to run it comes back Review of Systems Denies wheezing does relate some running vomiting diarrhea    Objective:   Physical Exam Very faint red rash lower neck on the anterior aspect no sign of any type of tinea or abscess no impetigo Lungs clear heart regular Subjective lateral tenderness of the knee when he runs but no tenderness currently ligament strong no clicks       Assessment & Plan:  Intermittent rash probably either heat related or irritant related may use triamcinolone cream twice daily as needed  Allergic rhinitis refills of Dymista given for the next year  Left lateral knee pain-probably more ligamentous or possible meniscus no need for x-rays or MRI currently if not improved with Fore foot running over the next month notify us and we will help set up with orthopedics

## 2017-04-09 ENCOUNTER — Encounter: Payer: Self-pay | Admitting: Family Medicine

## 2017-04-09 ENCOUNTER — Ambulatory Visit: Payer: BC Managed Care – PPO | Admitting: Family Medicine

## 2017-04-09 VITALS — BP 122/76 | Temp 97.9°F | Ht 68.0 in | Wt 162.2 lb

## 2017-04-09 DIAGNOSIS — B9689 Other specified bacterial agents as the cause of diseases classified elsewhere: Secondary | ICD-10-CM | POA: Diagnosis not present

## 2017-04-09 DIAGNOSIS — J019 Acute sinusitis, unspecified: Secondary | ICD-10-CM

## 2017-04-09 MED ORDER — AMOXICILLIN-POT CLAVULANATE 875-125 MG PO TABS: 1.0000 | ORAL_TABLET | Freq: Two times a day (BID) | ORAL | 0 refills | Status: DC

## 2017-04-09 NOTE — Progress Notes (Signed)
   Subjective:    Patient ID: Steve LucksMatthew Lucas, male    DOB: 09/21/1990, 26 y.o.   MRN: [id number]  Cough  This is a new problem. The current episode started in the past 7 days. Associated symptoms include headaches, nasal congestion, rhinorrhea, a sore throat and wheezing. Pertinent negatives include no chest pain, ear pain or fever. Treatments tried: otc cold meds.   Has had 4-5 days of head congestion drainage coughing sinus pressure denies wheezing difficulty breathing   Review of Systems  Constitutional: Negative for activity change and fever.  HENT: Positive for congestion, rhinorrhea and sore throat. Negative for ear pain.   Eyes: Negative for discharge.  Respiratory: Positive for cough and wheezing.   Cardiovascular: Negative for chest pain.  Neurological: Positive for headaches.       Objective:   Physical Exam  Constitutional: He appears well-developed.  HENT:  Head: Normocephalic.  Mouth/Throat: Oropharynx is clear and moist. No oropharyngeal exudate.  Neck: Normal range of motion.  Cardiovascular: Normal rate, regular rhythm and normal heart sounds.  No murmur heard. Pulmonary/Chest: Effort normal and breath sounds normal. He has no wheezes.  Lymphadenopathy:    He has no cervical adenopathy.  Neurological: He exhibits normal muscle tone.  Skin: Skin is warm and dry.  Nursing note and vitals reviewed.         Assessment & Plan:  Patient was seen today for upper respiratory illness. It is felt that the patient is dealing with sinusitis. Antibiotics were prescribed today. Importance of compliance with medication was discussed. Symptoms should gradually resolve over the course of the next several days. If high fevers, progressive illness, difficulty breathing, worsening condition or failure for symptoms to improve over the next several days then the patient is to follow-up. If any emergent conditions the patient is to follow-up in the emergency department otherwise  to follow-up in the office.

## 2017-05-02 ENCOUNTER — Other Ambulatory Visit: Payer: Self-pay | Admitting: Family Medicine

## 2017-05-02 MED ORDER — PSEUDOEPHEDRINE HCL ER 120 MG PO TB12: 120.0000 mg | ORAL_TABLET | Freq: Two times a day (BID) | ORAL | 3 refills | Status: DC | PRN

## 2017-05-22 ENCOUNTER — Ambulatory Visit: Payer: BC Managed Care – PPO | Admitting: Family Medicine

## 2017-05-22 ENCOUNTER — Encounter: Payer: Self-pay | Admitting: Family Medicine

## 2017-05-22 VITALS — BP 108/72 | Temp 98.2°F | Ht 68.0 in | Wt 157.0 lb

## 2017-05-22 DIAGNOSIS — S46811A Strain of other muscles, fascia and tendons at shoulder and upper arm level, right arm, initial encounter: Secondary | ICD-10-CM | POA: Diagnosis not present

## 2017-05-22 MED ORDER — DICLOFENAC SODIUM 75 MG PO TBEC: 75.0000 mg | DELAYED_RELEASE_TABLET | Freq: Two times a day (BID) | ORAL | 1 refills | Status: DC

## 2017-05-22 MED ORDER — CHLORZOXAZONE 500 MG PO TABS: 500.0000 mg | ORAL_TABLET | Freq: Three times a day (TID) | ORAL | 1 refills | Status: DC | PRN

## 2017-05-22 NOTE — Progress Notes (Signed)
   Subjective:    Patient ID: Steve Lucas, male    DOB: 10/19/1990, 27 y.o.   MRN: [id number]  HPI  Patient is here today with complaints of right sided shoulder and neck pain.Started two weeks ago when he woke up with it. He thinks he may have injured it with carrying his drum.He states he made it worse yesterday while working out. He states he has been icing it,and using Naproxin did not help much. He thinks he may have injured that as stated above The patient relates that this is a discomfort in the upper trapezius region into the side of his neck does not radiate down the arm there is no weakness but there is pain when he tries to do pull-ups he is never had this before    Objective:   Physical Exam Reflexes in the biceps normal hand strength normal rotator cuff normal bilateral upper trapezius on the right side mild tenderness mid trapezius mild tenderness       Assessment & Plan:  The trapezius pain should gradually get better Exercise printout was given Anti-inflammatories twice daily for the next 10-14 days avoid other anti-inflammatory Muscle relaxers only if necessary and use Avoid strenuous activity Follow-up if progressive troubles If not dramatically better over the next 2 weeks orthopedic referral

## 2017-09-10 ENCOUNTER — Other Ambulatory Visit: Payer: Self-pay

## 2017-09-10 ENCOUNTER — Encounter (HOSPITAL_COMMUNITY): Payer: Self-pay | Admitting: Emergency Medicine

## 2017-09-10 ENCOUNTER — Emergency Department (HOSPITAL_COMMUNITY)
Admission: EM | Admit: 2017-09-10 | Discharge: 2017-09-10 | Disposition: A | Discharge status: Home / Self Care | Payer: BC Managed Care – PPO | Attending: Emergency Medicine | Admitting: Emergency Medicine

## 2017-09-10 ENCOUNTER — Emergency Department (HOSPITAL_COMMUNITY): Payer: BC Managed Care – PPO

## 2017-09-10 DIAGNOSIS — Y929 Unspecified place or not applicable: Secondary | ICD-10-CM | POA: Insufficient documentation

## 2017-09-10 DIAGNOSIS — S81852A Open bite, left lower leg, initial encounter: Secondary | ICD-10-CM | POA: Diagnosis present

## 2017-09-10 DIAGNOSIS — Z2914 Encounter for prophylactic rabies immune globin: Secondary | ICD-10-CM | POA: Insufficient documentation

## 2017-09-10 DIAGNOSIS — W540XXA Bitten by dog, initial encounter: Secondary | ICD-10-CM | POA: Insufficient documentation

## 2017-09-10 DIAGNOSIS — Y939 Activity, unspecified: Secondary | ICD-10-CM | POA: Diagnosis not present

## 2017-09-10 DIAGNOSIS — S80811A Abrasion, right lower leg, initial encounter: Secondary | ICD-10-CM | POA: Diagnosis not present

## 2017-09-10 DIAGNOSIS — Z79899 Other long term (current) drug therapy: Secondary | ICD-10-CM | POA: Diagnosis not present

## 2017-09-10 DIAGNOSIS — Z23 Encounter for immunization: Secondary | ICD-10-CM | POA: Diagnosis not present

## 2017-09-10 DIAGNOSIS — Y998 Other external cause status: Secondary | ICD-10-CM | POA: Insufficient documentation

## 2017-09-10 MED ORDER — DOUBLE ANTIBIOTIC 500-10000 UNIT/GM EX OINT
TOPICAL_OINTMENT | CUTANEOUS | Status: AC
  Filled 2017-09-10: qty 1

## 2017-09-10 MED ORDER — RABIES IMMUNE GLOBULIN 150 UNIT/ML IM INJ
20.0000 [IU]/kg | INJECTION | Freq: Once | INTRAMUSCULAR | Status: AC
  Administered 2017-09-10: 1425 [IU] via INTRAMUSCULAR
  Filled 2017-09-10: qty 9.5

## 2017-09-10 MED ORDER — AMOXICILLIN-POT CLAVULANATE 875-125 MG PO TABS
1.0000 | ORAL_TABLET | Freq: Once | ORAL | Status: AC
  Administered 2017-09-10: 1 via ORAL
  Filled 2017-09-10: qty 1

## 2017-09-10 MED ORDER — RABIES VACCINE, PCEC IM SUSR
1.0000 mL | Freq: Once | INTRAMUSCULAR | Status: AC
  Administered 2017-09-10: 1 mL via INTRAMUSCULAR
  Filled 2017-09-10: qty 1

## 2017-09-10 MED ORDER — AMOXICILLIN-POT CLAVULANATE 875-125 MG PO TABS: 1.0000 | ORAL_TABLET | Freq: Two times a day (BID) | ORAL | 0 refills | Status: AC

## 2017-09-10 NOTE — ED Triage Notes (Signed)
Patient states he was bit by a pit bull today to bilateral legs. States it is unknown if dog has had shots. Patient has puncture wounds to bilateral legs. Bleeding controlled at this time.

## 2017-09-10 NOTE — ED Notes (Signed)
Rabies form faxed to short stay and pharmacy

## 2017-09-10 NOTE — Discharge Instructions (Signed)
It is very important to keep the wounds clean and dry.  Gently wash with soap and water.  You can apply either Neosporin or bacitracin.  Make sure that the wounds are dry before putting any bandages on top of them.  Take antibiotics as directed.  You receive the rabies vaccines today.  He will have repeat rabies injections.  The nurses will direct you on where to go to get those vaccines.  Today is day 0.  You will return on day 3, 7, 14 for further injections.  Return to the emergency department for any warmth or redness around your wound, drainage from your wounds, fever, worsening pain or any other worsening or concerning symptoms.

## 2017-09-10 NOTE — ED Provider Notes (Signed)
[facility] EMERGENCY DEPARTMENT Provider Note   CSN: [id number] Arrival date & time: 09/10/17  1707     History   Chief Complaint Chief Complaint  Patient presents with  . Animal Bite    HPI Steve Lucas is a 27 y.o. male who presents for evaluation of animal bite to right tib-fib that occurred this afternoon.  Patient reports that he was bitten by a pimple that he does not know the status of the dog's immunizations.  He states that he was wearing jeans at the time.  Patient reports he was bitten on the distal anterior aspect of his right lower extremity.  Patient also sustained a abrasion and bite noted to the left lower extremity.  Patient states his tetanus is up-to-date.  Patient denies any fevers.  The history is provided by the patient.    History reviewed. No pertinent past medical history.  Patient Active Problem List   Diagnosis Date Noted  . Sinusitis, chronic 08/05/2012    Past Surgical History:  Procedure Laterality Date  . LAPAROSCOPIC APPENDECTOMY  07/18/11  . LAPAROSCOPIC APPENDECTOMY  07/18/2011   Procedure: APPENDECTOMY LAPAROSCOPIC;  Surgeon: Atilano Ina, MD,FACS;  Location: [facility]S;  Service: General;  Laterality: N/A;  . WISDOM TOOTH EXTRACTION          Home Medications    Prior to Admission medications   Medication Sig Start Date End Date Taking? Authorizing Provider  amoxicillin-clavulanate (AUGMENTIN) 875-125 MG tablet Take 1 tablet by mouth every 12 (twelve) hours for 7 days. 09/10/17 09/17/17  Maxwell Caul, PA-C  Azelastine-Fluticasone (DYMISTA) 137-50 MCG/ACT SUSP SPRAY 1 SPRAY IN EACH NOSTRIL TWICE DAILY. 03/08/17   Babs Sciara, MD  chlorzoxazone (PARAFON FORTE DSC) 500 MG tablet Take 1 tablet (500 mg total) by mouth 3 (three) times daily as needed for muscle spasms. 05/22/17   Babs Sciara, MD  diclofenac (VOLTAREN) 75 MG EC tablet Take 1 tablet (75 mg total) by mouth 2 (two) times daily. 05/22/17   Babs Sciara, MD    pseudoephedrine (QC SUPHEDRINE MAXIMUM STRENGTH) 120 MG 12 hr tablet Take 1 tablet (120 mg total) by mouth every 12 (twelve) hours as needed for congestion. 05/02/17   Babs Sciara, MD  triamcinolone cream (KENALOG) 0.1 % Apply twice daily as needed for neck rash Patient not taking: Reported on 05/22/2017 03/08/17   Babs Sciara, MD    Family History Family History  Problem Relation Age of Onset  . Cancer Paternal Grandfather        leukemia    Social History Social History   Tobacco Use  . Smoking status: Never Smoker  . Smokeless tobacco: Never Used  Substance Use Topics  . Alcohol use: Yes    Comment: rarely  . Drug use: No     Allergies   Mold extract [trichophyton] and Other   Review of Systems Review of Systems  Constitutional: Negative for fever.  Skin: Positive for wound.     Physical Exam Updated Vital Signs BP 110/71 (BP Location: Right Arm)   Pulse 80   Temp 98.1 F (36.7 C) (Oral)   Resp 16   Wt 69.4 kg (153 lb)   SpO2 100%   BMI 23.26 kg/m   Physical Exam  Constitutional: He appears well-developed and well-nourished.  HENT:  Head: Normocephalic and atraumatic.  Eyes: Conjunctivae and EOM are normal. Right eye exhibits no discharge. Left eye exhibits no discharge. No scleral icterus.  Pulmonary/Chest: Effort normal.  Neurological: He is alert.  Skin: Skin is warm and dry.     Psychiatric: He has a normal mood and affect. His speech is normal and behavior is normal.  Nursing note and vitals reviewed.    ED Treatments / Results  Labs (all labs ordered are listed, but only abnormal results are displayed) Labs Reviewed - No data to display  EKG None  Radiology Dg Tibia/fibula Left  Result Date: 09/10/2017 CLINICAL DATA:  Dog bite to bilateral lower legs EXAM: LEFT TIBIA AND FIBULA - 2 VIEW COMPARISON:  None. FINDINGS: No fracture or dislocation is seen. The joint spaces are preserved. The visualized soft tissues are unremarkable.  No radiopaque foreign body is seen. IMPRESSION: Negative. Electronically Signed   By: Charline Bills M.D.   On: 09/10/2017 18:59   Dg Tibia/fibula Right  Result Date: 09/10/2017 CLINICAL DATA:  Dog bite to bilateral lower legs EXAM: RIGHT TIBIA AND FIBULA - 2 VIEW COMPARISON:  None. FINDINGS: No fracture or dislocation is seen. The joint spaces are preserved. Visualized soft tissues are within normal limits. No radiopaque foreign body is seen. IMPRESSION: Negative. Electronically Signed   By: Charline Bills M.D.   On: 09/10/2017 18:58    Procedures Procedures (including critical care time)  Medications Ordered in ED Medications  rabies immune globulin (HYPERAB/KEDRAB) injection 1,425 Units (1,425 Units Intramuscular Given 09/10/17 1834)  rabies vaccine (RABAVERT) injection 1 mL (1 mL Intramuscular Given 09/10/17 1819)  amoxicillin-clavulanate (AUGMENTIN) 875-125 MG per tablet 1 tablet (1 tablet Oral Given 09/10/17 1756)     Initial Impression / Assessment and Plan / ED Course  I have reviewed the triage vital signs and the nursing notes.  Pertinent labs & imaging results that were available during my care of the patient were reviewed by me and considered in my medical decision making (see chart for details).     27 year old male who presents for evaluation of dog bite that occurred this evening.  Patient states he does not know the status of the dog's immunizations.  He reports his tetanus is up-to-date. Patient is afebrile, non-toxic appearing, sitting comfortably on examination table. Vital signs reviewed and stable.  On exam, patient has a small abrasion noted to the anterior aspect of his right lower extremity that is overlying the distal tib-fib.  Additionally, he has a small bite wound noted to the muscular aspect of the left lower extremity.  Patient reports his tetanus is up-to-date.  Does not know the immunization status of the dog.  Discussed treatment options with patient.  We  will plan to give rabies prophylaxis here in the ED.  Additionally, will plan to start patient on antibiotics.  Given that there is a bite that overlies the distal tib-fib, will plan for x-ray evaluation.  X-rays reviewed.  Negative for any bony abnormality.  Discussed results with patient.  Wounds were thoroughly and extensively irrigated and cleaned here in the ED.  Antibacterial ointment was applied and wounds were bandaged.  Patient started on antibiotic therapy.  Given patient did not know the immunization status of the dog, he opted for rabies vaccines here in the ED.  Will give postexposure schedule.  Wound care precautions discussed with patient. Patient had ample opportunity for questions and discussion. All patient's questions were answered with full understanding. Strict return precautions discussed. Patient expresses understanding and agreement to plan.   Final Clinical Impressions(s) / ED Diagnoses   Final diagnoses:  Dog bite, initial encounter    ED Discharge Orders  Ordered    amoxicillin-clavulanate (AUGMENTIN) 875-125 MG tablet  Every 12 hours     09/10/17 1856       Rosana Hoes 09/11/17 0004    Mesner, Barbara Cower, MD 09/11/17 2250

## 2017-09-10 NOTE — ED Notes (Addendum)
Animal control notified. They are going to follow up with residence to check if dog immunized

## 2017-09-11 ENCOUNTER — Telehealth: Payer: Self-pay | Admitting: Family Medicine

## 2017-09-11 NOTE — Telephone Encounter (Signed)
Patient was bit by dog yesterday and went to ER and given antibiotics.He wanted you to know this and if their were further instruction he should do.I could barely hear him.

## 2017-09-11 NOTE — Telephone Encounter (Signed)
Called patient and he states he is fine. On augmentin and received rabies vaccine at the ED. Wants to know if he needs to do anything else. No problems at this time.

## 2017-09-11 NOTE — Telephone Encounter (Signed)
Please let the patient know that I read through the information from the ER.  Following through on rabies vaccine would be a good idea.  In addition to this should the area of the bite start looking infected it would be a good idea to follow-up.  Definitely a good idea to keep taking Augmentin.  If we can be of any further help we will will be more than happy to see him

## 2017-09-11 NOTE — Telephone Encounter (Signed)
Discussed with pt. Pt verbalized understanding.  °

## 2017-09-13 ENCOUNTER — Encounter (HOSPITAL_COMMUNITY)
Admission: RE | Admit: 2017-09-13 | Discharge: 2017-09-13 | Disposition: A | Discharge status: Home / Self Care | Payer: BC Managed Care – PPO | Source: Ambulatory Visit | Attending: Emergency Medicine | Admitting: Emergency Medicine

## 2017-09-13 DIAGNOSIS — Z2914 Encounter for prophylactic rabies immune globin: Secondary | ICD-10-CM | POA: Diagnosis not present

## 2017-09-13 MED ORDER — RABIES VACCINE, PCEC IM SUSR
INTRAMUSCULAR | Status: AC
  Filled 2017-09-13: qty 1

## 2017-09-13 MED ORDER — RABIES VACCINE, PCEC IM SUSR
1.0000 mL | Freq: Once | INTRAMUSCULAR | Status: AC
  Administered 2017-09-13: 1 mL via INTRAMUSCULAR

## 2017-09-17 ENCOUNTER — Encounter (HOSPITAL_COMMUNITY)
Admission: RE | Admit: 2017-09-17 | Discharge: 2017-09-17 | Disposition: A | Discharge status: Home / Self Care | Payer: BC Managed Care – PPO | Source: Ambulatory Visit | Attending: Emergency Medicine | Admitting: Emergency Medicine

## 2017-09-17 DIAGNOSIS — Z2914 Encounter for prophylactic rabies immune globin: Secondary | ICD-10-CM | POA: Diagnosis not present

## 2017-09-17 MED ORDER — RABIES VACCINE, PCEC IM SUSR
INTRAMUSCULAR | Status: AC
Start: 2017-09-17 — End: 2017-09-17
  Filled 2017-09-17: qty 1

## 2017-09-17 MED ORDER — RABIES VACCINE, PCEC IM SUSR
1.0000 mL | Freq: Once | INTRAMUSCULAR | Status: AC
  Administered 2017-09-17: 1 mL via INTRAMUSCULAR

## 2017-09-24 ENCOUNTER — Encounter (HOSPITAL_COMMUNITY)
Admission: RE | Admit: 2017-09-24 | Discharge: 2017-09-24 | Disposition: A | Discharge status: Home / Self Care | Payer: BC Managed Care – PPO | Source: Ambulatory Visit | Attending: Emergency Medicine | Admitting: Emergency Medicine

## 2017-09-24 DIAGNOSIS — Z2914 Encounter for prophylactic rabies immune globin: Secondary | ICD-10-CM | POA: Diagnosis not present

## 2017-09-24 MED ORDER — RABIES VACCINE, PCEC IM SUSR
1.0000 mL | Freq: Once | INTRAMUSCULAR | Status: AC
  Administered 2017-09-24: 1 mL via INTRAMUSCULAR
  Filled 2017-09-24: qty 1

## 2017-10-26 ENCOUNTER — Other Ambulatory Visit: Payer: Self-pay | Admitting: Family Medicine

## 2018-01-23 ENCOUNTER — Ambulatory Visit (INDEPENDENT_AMBULATORY_CARE_PROVIDER_SITE_OTHER): Payer: BC Managed Care – PPO | Admitting: Family Medicine

## 2018-01-23 ENCOUNTER — Encounter: Payer: Self-pay | Admitting: Family Medicine

## 2018-01-23 VITALS — BP 110/72 | Temp 98.5°F | Ht 68.0 in | Wt 148.0 lb

## 2018-01-23 DIAGNOSIS — H1012 Acute atopic conjunctivitis, left eye: Secondary | ICD-10-CM

## 2018-01-23 DIAGNOSIS — R41840 Attention and concentration deficit: Secondary | ICD-10-CM

## 2018-01-23 DIAGNOSIS — N399 Disorder of urinary system, unspecified: Secondary | ICD-10-CM

## 2018-01-23 DIAGNOSIS — F988 Other specified behavioral and emotional disorders with onset usually occurring in childhood and adolescence: Secondary | ICD-10-CM

## 2018-01-23 MED ORDER — OLOPATADINE HCL 0.1 % OP SOLN: OPHTHALMIC | 12 refills | Status: DC

## 2018-01-23 NOTE — Progress Notes (Addendum)
   Subjective:    Patient ID: Steve Lucas, male    DOB: [birth year], 27 y.o.   MRN: [id number]  HPIleft eye irritation. Feels like something is in eye. Started a few days ago. Tried moisturizing eye drops.  Patient with eye irritation present for several days burning redness denies any other particular troubles does not cross together  Also he is concerned about his focus states he has a hard time staying attentive and focused at school and this is been going on ever since elementary he gets in the way of his learning and focusing and studying he is wondering if he should consider being on medication he is interested in trying it because his brother is been on medicine Patient is not having any depression anxiety or mania.  There is no other comorbidities.  It is apparent when talking with him that he fails to give close attention to details he also makes careless mistakes that he should not make.  In addition to this he has difficult time sustaining attention to tasks and often gets distracted frequently both with work and school. He also states he does not always follow through on instructions and often fails to finish what he is working on.  He does find it is difficult for him to organize and he is reluctant to engage in activities at he does not like or has a harder time with in addition to this he gets easily distracted by external stimuli and is forgetful of appointments and other matters  He typically does not have hyperactivity but he does state that he tends to be high energy.  He does state that he squirms frequently and fidgets a lot and taps with his hands a lot.  He does state he misplaces things both at home and at work and these symptoms go all the way back to when he was in grade school [employer] in middle school high school and postgraduate he does state that he has difficult time waiting his turn as well.  Interview regarding his ADHD symptoms was completed.  The  patient also filled out self-report questionnaire This questionnaire was scanned into the system It does show a strong tendencies toward ADHD.  Patient also relates that he had semen come out with his urine but did not see any blood with it this concern him denied any other particular troubles no abdominal pain or flank pain   Review of Systems No other particular findings.   Denies high fever chills wheezing nausea vomiting diarrhea. Objective:   Physical Exam Both eyes pupils responsive to light eardrums normal throat is normal neck no masses lungs clear no crackles heart regular GU normal testicles normal Left eye looks to be allergic conjunctivitis based upon redness and papilla at the conjunctive a no sign of any type of scratched cornea       Assessment & Plan:  Allergic conjunctivitis allergy drops as directed  Semen in the urine normal but if he starts to have blood in the urine he needs to let us know  In my opinion the patient does have ADD.  He meets the DSM qualifications for this diagnosis as well as a positive questionnaire.  We will initiate medication to help him.

## 2018-02-15 ENCOUNTER — Telehealth: Payer: Self-pay | Admitting: Family Medicine

## 2018-02-15 NOTE — Telephone Encounter (Signed)
Pt dropped off ADHD self report scale checklist. Placed in Dr.Scott box

## 2018-02-24 NOTE — Telephone Encounter (Signed)
Please let patient know that I did review over his self-report scale.  It does indicate a relatively increased likelihood of ADHD Given his symptomatology and also given what he told me on his last visit it is reasonable to consider starting medication ADD medicine can improve the symptoms of ADD and ADHD by about 40% The medication is typically taken in the morning and last 8 to 9 hours of For most people they tolerate the medicine well He can decrease appetite And a small number people it can cause insomnia or palpitations if that happens we recommend stopping the medicine If there is any significant issue we recommend stopping the medicine and letting us know I would recommend taking it approximately 45 minutes to 60 minutes before classes Please talk with the patient if he is interested in the medication I recommend the following Medication typically used Adderall XR-given that he is not been on this medicine before we started off at a low dosage 10 mg XR is a beginning dose I can send this into his pharmacy if he is interested We would recommend a follow-up office visit 3 to 4 weeks after starting medication in order to discuss how the medication is doing  Please talk with the patient about the above

## 2018-02-25 ENCOUNTER — Other Ambulatory Visit: Payer: Self-pay | Admitting: Family Medicine

## 2018-02-25 ENCOUNTER — Encounter: Payer: Self-pay | Admitting: Family Medicine

## 2018-02-25 MED ORDER — AMPHETAMINE-DEXTROAMPHET ER 10 MG PO CP24: 10.0000 mg | ORAL_CAPSULE | Freq: Every day | ORAL | 0 refills | Status: DC

## 2018-02-25 NOTE — Telephone Encounter (Signed)
Discussed with pt. Pt wants med sent to [facility] in [place] pharm. Pt states he does not need a call back the pharm will let him know he has an rx ready.

## 2018-02-25 NOTE — Telephone Encounter (Signed)
Left message to return call 

## 2018-02-25 NOTE — Telephone Encounter (Signed)
Medication was sent in electronically to [facility] patient will do follow-up office visit within 30 days

## 2018-02-27 ENCOUNTER — Telehealth: Payer: Self-pay | Admitting: Family Medicine

## 2018-02-27 NOTE — Telephone Encounter (Signed)
Being handled via phone

## 2018-02-27 NOTE — Telephone Encounter (Signed)
Will need to do PA 

## 2018-02-27 NOTE — Telephone Encounter (Signed)
Nurses-this will require some increase interaction Please see telephone message Please inform patient we are looking into this Please let the patient know that we always start with a dose that we feel is reasonable to begin with I certainly understand his brother is on 20 mg The patient may end up being on 20 mg But the starting dose would be 10 mg It is also standard encouraged Corrie Dandy to do a follow-up visit within 30 days to reassess this is doing then adjust accordingly Let the patient know we are looking into the reason why is is $195? Feel free to send him a my chart message

## 2018-02-27 NOTE — Telephone Encounter (Signed)
See phone message. Called pharm and they state med needs a PA and insurance will cover vyvance. Change or do PA?

## 2018-02-27 NOTE — Telephone Encounter (Signed)
Pt wants to try to get Adderall approved.

## 2018-02-27 NOTE — Telephone Encounter (Signed)
Please connect with the patient His choices are we can try to get the prior approval which obviously would lower his cost of the medicine Please explain what a prior approval is in that it may or may not get approved It may or may not require trying 1 of the covered medicines first  Other option is we can use Vyvanse which is on his insurance and is a very good ADD medicine as well and does not require prior approval  Please figure out what the patient would like to do and let me know

## 2018-02-27 NOTE — Telephone Encounter (Signed)
Call [facility] pharm and pharm states med is requiring a PA. Informed her our office never received a fax stating med needs PA. She states she will sent it. And she told me insurance will cover vyvance without PA. Do you want to do PA or change to vyvance.

## 2018-02-27 NOTE — Telephone Encounter (Signed)
Nurses For some reason the patient states his medication is $195 His brother has the same insurance He has been told that his brother only pays a $10 co-pay Please find out from the pharmacy why is his medicine the $195 Is this medication not covered?  Is her other medicines that are preferred?  Make sure that the medication that was sent in is generic Adderall XR Are there other alternatives that are less expensive Thank you

## 2018-02-28 NOTE — Telephone Encounter (Signed)
Pa submitted this morning. Await response

## 2018-03-05 NOTE — Telephone Encounter (Signed)
Fax from [facility] amphetamine dextroamphetamine er 10mg  is approved from 03/01/18 -03/05/2021. Called [facility] pharm and left approval on their voicemail and pt was notified.

## 2018-03-07 ENCOUNTER — Ambulatory Visit: Payer: BC Managed Care – PPO | Admitting: Family Medicine

## 2018-03-07 ENCOUNTER — Encounter: Payer: Self-pay | Admitting: Family Medicine

## 2018-03-07 VITALS — BP 110/78 | Temp 97.6°F | Ht 68.0 in | Wt 149.0 lb

## 2018-03-07 DIAGNOSIS — J019 Acute sinusitis, unspecified: Secondary | ICD-10-CM | POA: Diagnosis not present

## 2018-03-07 MED ORDER — AMOXICILLIN-POT CLAVULANATE 875-125 MG PO TABS: 1.0000 | ORAL_TABLET | Freq: Two times a day (BID) | ORAL | 0 refills | Status: AC

## 2018-03-07 NOTE — Progress Notes (Signed)
   Subjective:    Patient ID: Steve Lucas, male    DOB: 20-Apr-1991, 27 y.o.   MRN: [id number]  HPI Patient is here today with complaints of a sinus infection.He states he has some sinus drainage and sore throat and cough. He states the sputum has some color to it. He states the symptoms began on Monday. He has been taking sudafed,diamista nasal spray and nasal rinse.   Review of Systems  Constitutional: Negative for fever.  HENT: Positive for congestion and sore throat. Negative for ear pain.   Respiratory: Positive for cough. Negative for shortness of breath.   Gastrointestinal: Negative for nausea and vomiting.  Neurological: Negative for headaches.      Objective:   Physical Exam  Constitutional: He is oriented to person, place, and time. He appears well-developed and well-nourished. No distress.  HENT:  Head: Normocephalic and atraumatic.  Right Ear: Tympanic membrane normal.  Left Ear: Tympanic membrane normal.  Nose: Nose normal. No sinus tenderness.  Mouth/Throat: Uvula is midline and oropharynx is clear and moist.  Eyes: Right eye exhibits no discharge. Left eye exhibits no discharge.  Neck: Neck supple.  Cardiovascular: Normal rate, regular rhythm and normal heart sounds.  Pulmonary/Chest: Effort normal and breath sounds normal. No respiratory distress. He has no wheezes.  Lymphadenopathy:    He has no cervical adenopathy.  Neurological: He is alert and oriented to person, place, and time.  Skin: Skin is warm and dry.  Nursing note and vitals reviewed.     Assessment & Plan:  Acute rhinosinusitis Discussed with patient that I feel this is most likely a viral infection and will not benefit from abx. However given his history of recurrent sinusitis and sinus surgery gave printed rx for augmentin. Encouraged him to give it at least another 48 hours and if his symptoms are improving then there is no need to fill the abx, however if symptoms are worsening at that time  he may fill it. Pt verbalized understanding and is in agreement with this plan. Warning signs discussed. F/u prn.  Encouraged to keep his upcoming appt with Dr. Lorin Picket for f/u on his ADD medication.  Dr. Lorin Picket was consulted on this case and is in agreement with the above treatment plan.

## 2018-03-18 ENCOUNTER — Other Ambulatory Visit: Payer: Self-pay | Admitting: Family Medicine

## 2018-03-21 ENCOUNTER — Ambulatory Visit (INDEPENDENT_AMBULATORY_CARE_PROVIDER_SITE_OTHER): Payer: BC Managed Care – PPO | Admitting: Family Medicine

## 2018-03-21 ENCOUNTER — Encounter: Payer: Self-pay | Admitting: Family Medicine

## 2018-03-21 VITALS — BP 108/66 | Wt 147.0 lb

## 2018-03-21 DIAGNOSIS — F988 Other specified behavioral and emotional disorders with onset usually occurring in childhood and adolescence: Secondary | ICD-10-CM

## 2018-03-21 DIAGNOSIS — R41840 Attention and concentration deficit: Secondary | ICD-10-CM

## 2018-03-21 MED ORDER — AMPHETAMINE-DEXTROAMPHET ER 20 MG PO CP24: 20.0000 mg | ORAL_CAPSULE | Freq: Every day | ORAL | 0 refills | Status: DC

## 2018-03-21 NOTE — Progress Notes (Signed)
   Subjective:    Patient ID: Steve Lucas, male    DOB: December 27, 1990, 27 y.o.   MRN: [id number]  HPI Patient has adult ADD Recently started on medicine Tolerating the medicine well no headaches no blood pressure issues slight weight loss because of healthier eating regular activity patient denies any health issues currently states tolerating medicine well but he does not feel it is effective he would like to try a higher dose this is reasonable PMH ADD   Review of Systems  Constitutional: Negative for activity change, appetite change and fatigue.  HENT: Negative for congestion and rhinorrhea.   Respiratory: Negative for cough and shortness of breath.   Cardiovascular: Negative for chest pain and leg swelling.  Gastrointestinal: Negative for abdominal pain, nausea and vomiting.  Neurological: Negative for dizziness and headaches.  Psychiatric/Behavioral: Negative for agitation and behavioral problems.       Objective:   Physical Exam  Constitutional: He appears well-developed and well-nourished. No distress.  HENT:  Head: Normocephalic.  Cardiovascular: Normal rate, regular rhythm and normal heart sounds.  No murmur heard. Pulmonary/Chest: Effort normal and breath sounds normal.  Neurological: He is alert.  Skin: Skin is warm and dry.  Psychiatric: He has a normal mood and affect. His behavior is normal.          Assessment & Plan:  Adult ADD Tolerating medicine well Medication not having much effect for him currently Double up the dose to 20 mg Patient to give [place] feedback in a few weeks time how this is doing May need to gradually increase the dosage Patient was educated that even in the best of circumstances medication will only improve the symptoms of adult ADD by 40%  He was also educated that we would need to see him every 3 months

## 2018-03-26 ENCOUNTER — Telehealth: Payer: Self-pay | Admitting: Family Medicine

## 2018-03-26 NOTE — Telephone Encounter (Signed)
Adderall XR approved through 03/25/18-03/25/2021. [facility] pharmacy and contacted patient. Pt verbalized understanding.

## 2018-03-26 NOTE — Telephone Encounter (Signed)
PA approved for Adderall XR 20 mg. Approved from 11/19-11/03/2021.

## 2018-04-18 ENCOUNTER — Encounter: Payer: Self-pay | Admitting: Family Medicine

## 2018-04-20 ENCOUNTER — Other Ambulatory Visit: Payer: Self-pay | Admitting: Family Medicine

## 2018-04-20 MED ORDER — AMPHETAMINE-DEXTROAMPHET ER 25 MG PO CP24: 25.0000 mg | ORAL_CAPSULE | Freq: Every day | ORAL | 0 refills | Status: DC

## 2018-04-30 ENCOUNTER — Telehealth: Payer: Self-pay

## 2018-04-30 DIAGNOSIS — F988 Other specified behavioral and emotional disorders with onset usually occurring in childhood and adolescence: Secondary | ICD-10-CM | POA: Insufficient documentation

## 2018-04-30 NOTE — Telephone Encounter (Signed)
Pa for Amphetamin Dextroamphet 25 mg is in your box. I have filled in what I could.

## 2018-05-02 NOTE — Telephone Encounter (Signed)
I filled out the form on this particular patient.  It is possible that his insurance company may recheck this medication.  If so we would need to know what other choices they would cover. Feel free to go ahead and send the form and thank you

## 2018-05-02 NOTE — Telephone Encounter (Signed)
Faxed form to insurance 05/02/18

## 2018-05-03 NOTE — Telephone Encounter (Signed)
Medication denied by insurance. See denial in folder on wall 

## 2018-05-09 NOTE — Telephone Encounter (Signed)
His insurance has put greater stipulations onto his ADD medicines.  It is requiring us to re-file additional data showing that he has adult ADD  I will need some additional information from the patient He had filled out a questionnaire when he was here but unfortunately that did not get filed into the electronics There is ADD questionnaire that he will need to fill out Once I receive that information I can refile the information with his insurance company to request the medication be covered  In most situations when we file the forms and data it will get approved-but in some situations insurance company still will not approve it because evaluation was not done by a behavioral health specialist and in those situations situations where the the appeal is rejected then the next step would be referral to a behavioral health specialist  So essentially Fill out the new form Send it back to us We will resubmit for approval of the medicine

## 2018-05-09 NOTE — Telephone Encounter (Signed)
Left message to return call 

## 2018-05-13 NOTE — Telephone Encounter (Signed)
Patient is aware of all and will come by to pick up the new forms.

## 2018-05-16 ENCOUNTER — Telehealth: Payer: Self-pay | Admitting: Family Medicine

## 2018-05-16 NOTE — Telephone Encounter (Signed)
ADD Self Report Scale Checklist dropped off placed in red folder in Dr.Scott's box.

## 2018-05-17 NOTE — Telephone Encounter (Signed)
Form in provider office 

## 2018-05-20 NOTE — Telephone Encounter (Signed)
The patient is filled out his evaluation form ADD form positive for ADD Documentation from January 23, 2018 should be adequate to verify his diagnosis Verify with patient that he would like his medicine sent into [facility] Please send in Adderall XR 25 mg 1 daily If his insurance company approves this then no troubles They may require prior authorization that would need to include the questionnaire as well as documentation from January 23, 2018 Unfortunately if that is still not good enough then the next step would be referral to behavioral health specialist for documentation and reissuing of medication

## 2018-05-21 ENCOUNTER — Other Ambulatory Visit: Payer: Self-pay | Admitting: *Deleted

## 2018-05-21 ENCOUNTER — Other Ambulatory Visit: Payer: Self-pay | Admitting: Family Medicine

## 2018-05-21 ENCOUNTER — Telehealth: Payer: Self-pay | Admitting: *Deleted

## 2018-05-21 MED ORDER — AMPHETAMINE-DEXTROAMPHET ER 25 MG PO CP24: 25.0000 mg | ORAL_CAPSULE | Freq: Every day | ORAL | 0 refills | Status: DC

## 2018-05-21 NOTE — Telephone Encounter (Signed)
Pt returned call. Pt would like medication sent to [facility]. Sent medication to [facility]. Informed patient that if insurance does not cover this we will need to do PA. Informed patient of PA process and that additional documentation may need to be sent. Pt verbalized understanding.

## 2018-05-21 NOTE — Telephone Encounter (Signed)
Left message to return call 

## 2018-05-21 NOTE — Telephone Encounter (Signed)
Script printed. Resent you the rx and pended order so you can sign

## 2018-05-22 ENCOUNTER — Other Ambulatory Visit: Payer: Self-pay | Admitting: Family Medicine

## 2018-05-22 MED ORDER — AMPHETAMINE-DEXTROAMPHET ER 25 MG PO CP24: 25.0000 mg | ORAL_CAPSULE | Freq: Every day | ORAL | 0 refills | Status: DC

## 2018-05-22 NOTE — Telephone Encounter (Signed)
Nurses-FYI-his medication was sent into [facility] Would not surprise me if it is denied I have ordered detailed documentation on that is previous visit back in September that should help justify getting medication Certainly if his insurance company has other ADD medicines that they prefer that is a different story to alert me to Thanks

## 2018-06-13 ENCOUNTER — Telehealth: Payer: Self-pay | Admitting: Family Medicine

## 2018-06-13 ENCOUNTER — Other Ambulatory Visit: Payer: Self-pay | Admitting: Family Medicine

## 2018-06-13 MED ORDER — AMPHETAMINE-DEXTROAMPHET ER 25 MG PO CP24: 25.0000 mg | ORAL_CAPSULE | Freq: Every day | ORAL | 0 refills | Status: DC

## 2018-06-13 NOTE — Telephone Encounter (Signed)
Front The patient was seen in the community on Thursday evening.  He informed me that something came up and he would not be able to keep his appointment on Friday.  He asked that this appointment be canceled he will call back to reschedule for February thank you  He has an appointment on Friday afternoon please cancel this visit to open it up for someone else

## 2018-06-13 NOTE — Telephone Encounter (Signed)
Nurses The patient states that the Adderall Exar 25 mg that we prescribed earlier in January his pharmacy would not fill  Please be aware that I resent the medication in on January 30 Please call [facility] pharmacy asked them that if this medication is been denied to make sure they send [place] the prior approval notice  Previous documentation was placed into his record from his September visit which thoroughly documents his adult ADD. Please figure out what the status is regarding if this medication will be filled Please send me an update regarding that thank you

## 2018-06-14 ENCOUNTER — Ambulatory Visit: Payer: BC Managed Care – PPO | Admitting: Family Medicine

## 2018-06-14 NOTE — Telephone Encounter (Signed)
I call Steve Lucas spoke with the pharmacist she states she has tried running the med through using State health plan it is showing none match card holder Id. Per older message dated 03/26/2018  in chart the 20 mg medication is supposed to be covered 03/25/2018-03/25/2021. I called the pt left a message asked that he r/c. So we can verify insurance.

## 2018-06-14 NOTE — Telephone Encounter (Signed)
Covered from 06/14/2018-06/14/2021 as long he stays on the [facility] plan and there are no changes to the plan.

## 2018-06-14 NOTE — Telephone Encounter (Signed)
I called and left a message to r/c. To let the pt know that medication should be ready for pick up at [facility].

## 2018-06-14 NOTE — Telephone Encounter (Signed)
Per Linden Dolin medication went through and will cost $5.Per Dr.Scott he needs to follow up in Feb.

## 2018-06-14 NOTE — Telephone Encounter (Signed)
Appointment has been canceled.

## 2018-06-14 NOTE — Telephone Encounter (Signed)
Per [place] Madding with [facility] it is rejecting and she will send the prior approval form over to our office.

## 2018-06-18 ENCOUNTER — Telehealth: Payer: Self-pay | Admitting: Family Medicine

## 2018-06-18 NOTE — Telephone Encounter (Signed)
Pt aware medication has been sent in to pharmacy.

## 2018-06-18 NOTE — Telephone Encounter (Signed)
Telephone call- voicemail is full 

## 2018-06-21 NOTE — Telephone Encounter (Signed)
error 

## 2018-07-09 ENCOUNTER — Encounter: Payer: Self-pay | Admitting: Family Medicine

## 2018-07-09 ENCOUNTER — Telehealth: Payer: Self-pay | Admitting: Family Medicine

## 2018-07-09 ENCOUNTER — Ambulatory Visit (INDEPENDENT_AMBULATORY_CARE_PROVIDER_SITE_OTHER): Payer: BC Managed Care – PPO | Admitting: Family Medicine

## 2018-07-09 VITALS — BP 120/84 | Ht 68.0 in | Wt 152.0 lb

## 2018-07-09 DIAGNOSIS — F988 Other specified behavioral and emotional disorders with onset usually occurring in childhood and adolescence: Secondary | ICD-10-CM | POA: Diagnosis not present

## 2018-07-09 MED ORDER — AMPHETAMINE-DEXTROAMPHET ER 30 MG PO CP24: 30.0000 mg | ORAL_CAPSULE | Freq: Every day | ORAL | 0 refills | Status: DC

## 2018-07-09 MED ORDER — AMPHETAMINE-DEXTROAMPHET ER 30 MG PO CP24: 30.0000 mg | ORAL_CAPSULE | ORAL | 0 refills | Status: DC

## 2018-07-09 NOTE — Progress Notes (Signed)
   Subjective:    Patient ID: Steve Lucas, male    DOB: [birth year], 28 y.o.   MRN: [id number]  HPIADD check up. Takes adderallxr 25mg  one daily. Pt states doing well on med.   No concerns today.   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  States medicine is helping him focus and stay more on track  Review of Systems  Constitutional: Negative for activity change, appetite change and fatigue.  HENT: Negative for congestion and rhinorrhea.   Respiratory: Negative for cough and shortness of breath.   Cardiovascular: Negative for chest pain and leg swelling.  Gastrointestinal: Negative for abdominal pain, nausea and vomiting.  Neurological: Negative for dizziness and headaches.  Psychiatric/Behavioral: Negative for agitation and behavioral problems.       Objective:   Physical Exam Constitutional:      General: He is not in acute distress.    Appearance: He is well-developed.  HENT:     Head: Normocephalic.  Cardiovascular:     Rate and Rhythm: Normal rate and regular rhythm.     Heart sounds: Normal heart sounds. No murmur.  Pulmonary:     Effort: Pulmonary effort is normal.     Breath sounds: Normal breath sounds.  Skin:    General: Skin is warm and dry.  Neurological:     Mental Status: He is alert.  Psychiatric:        Behavior: Behavior normal.     Patient works third shift so therefore he takes his medication around 10 PM every evening      Assessment & Plan:  ADD 3 new scripts were sent in We will go with 30 mg because he states 25 mg is helping but may not be helping quite enough  He will follow-up in early June we will send in one additional prescription for the end of May  He will let us know if any particular problems with the medicine

## 2018-07-09 NOTE — Telephone Encounter (Signed)
PA needed for Amphetamine-Dextroamphet ER 30 mg Capsules. PA attempted; awaiting decision.

## 2018-07-10 NOTE — Telephone Encounter (Signed)
Approved 07/10/2018-07/10/2021

## 2018-07-10 NOTE — Telephone Encounter (Signed)
Spoke with [facility] ran through for $5.00. Patient is aware.

## 2018-08-26 ENCOUNTER — Other Ambulatory Visit: Payer: Self-pay | Admitting: Family Medicine

## 2018-08-26 ENCOUNTER — Encounter: Payer: Self-pay | Admitting: Family Medicine

## 2018-08-26 NOTE — Telephone Encounter (Signed)
Back when the patient was seen 3 prescriptions were sent in He should be able to notify the pharmacy and they should have a prescription on file If they state that they do not have it on file or cannot find it I will resend it again I would stick with current dose

## 2018-09-03 ENCOUNTER — Other Ambulatory Visit: Payer: Self-pay | Admitting: Family Medicine

## 2018-09-09 ENCOUNTER — Other Ambulatory Visit: Payer: Self-pay | Admitting: Family Medicine

## 2018-09-25 ENCOUNTER — Encounter: Payer: Self-pay | Admitting: Family Medicine

## 2018-09-25 ENCOUNTER — Other Ambulatory Visit: Payer: Self-pay | Admitting: Family Medicine

## 2018-09-25 MED ORDER — AMPHETAMINE-DEXTROAMPHET ER 30 MG PO CP24: 30.0000 mg | ORAL_CAPSULE | Freq: Every day | ORAL | 0 refills | Status: DC

## 2018-09-25 MED ORDER — OLOPATADINE HCL 0.1 % OP SOLN: OPHTHALMIC | 5 refills | Status: DC

## 2018-09-25 MED ORDER — AZELASTINE-FLUTICASONE 137-50 MCG/ACT NA SUSP: NASAL | 0 refills | Status: DC

## 2018-09-25 NOTE — Addendum Note (Signed)
Addended by: Marlowe Shores on: 09/25/2018 11:31 AM   Modules accepted: Orders

## 2018-09-25 NOTE — Telephone Encounter (Signed)
1.  Let the patient know that I sent in his prescription for his ADD medicine to his pharmacy- patient will need to do a virtual office visit in late May or office visit early June for his further prescriptions of ADD medicine  #2 Dymista may be prescribed 1 spray each nostril twice daily, #1, 6 refills, brand name  #3 May use Pataday eyedrops 1 drop each eye daily for allergies, 1 bottle, 5 refills  #4 May use OTC store brand generic sertraline 10 mg daily or fexofenadine 180 mg daily

## 2018-10-04 ENCOUNTER — Other Ambulatory Visit: Payer: Self-pay

## 2018-10-04 MED ORDER — AZELASTINE-FLUTICASONE 137-50 MCG/ACT NA SUSP
NASAL | 0 refills | Status: DC
Start: 2018-10-04 — End: 2019-11-14

## 2018-10-14 ENCOUNTER — Ambulatory Visit (INDEPENDENT_AMBULATORY_CARE_PROVIDER_SITE_OTHER): Payer: BLUE CROSS/BLUE SHIELD | Admitting: Family Medicine

## 2018-10-14 ENCOUNTER — Other Ambulatory Visit: Payer: Self-pay

## 2018-10-14 DIAGNOSIS — F988 Other specified behavioral and emotional disorders with onset usually occurring in childhood and adolescence: Secondary | ICD-10-CM

## 2018-10-14 MED ORDER — AMPHETAMINE-DEXTROAMPHET ER 30 MG PO CP24: 30.0000 mg | ORAL_CAPSULE | ORAL | 0 refills | Status: DC

## 2018-10-14 MED ORDER — AMPHETAMINE-DEXTROAMPHET ER 30 MG PO CP24: 30.0000 mg | ORAL_CAPSULE | Freq: Every day | ORAL | 0 refills | Status: DC

## 2018-10-14 NOTE — Progress Notes (Signed)
   Subjective:    Patient ID: Steve Lucas, male    DOB: August 04, 1990, 28 y.o.   MRN: [id number]  HPIADD. Takes adderallxr 30mg  one daily.  Patient with adult ADD Takes his medicine regular basis States it does do a good job helping him he denies any side effects with it.  He works as a Scientist, water quality he does try to get adequate sleep during the day denies misusing the medicine In addition to this having significant allergies with some eye irritation the eyedrops are helping to some degree he is using other measures as well including Dymista Taking pataday eye drops and still has trouble with eyes being red.   Virtual Visit via Video Note  I connected with Steve Lucas on 10/14/18 at  3:00 PM EDT by a video enabled telemedicine application and verified that I am speaking with the correct person using two identifiers.  Location: Patient: home Provider: office   I discussed the limitations of evaluation and management by telemedicine and the availability of in person appointments. The patient expressed understanding and agreed to proceed.  History of Present Illness:    Observations/Objective:   Assessment and Plan:   Follow Up Instructions:    I discussed the assessment and treatment plan with the patient. The patient was provided an opportunity to ask questions and all were answered. The patient agreed with the plan and demonstrated an understanding of the instructions.   The patient was advised to call back or seek an in-person evaluation if the symptoms worsen or if the condition fails to improve as anticipated.  I provided 15 minutes of non-face-to-face time during this encounter.      Review of Systems  Constitutional: Negative for activity change, appetite change and fatigue.  HENT: Negative for congestion and rhinorrhea.   Respiratory: Negative for cough and shortness of breath.   Cardiovascular: Negative for chest pain and leg swelling.   Gastrointestinal: Negative for abdominal pain, nausea and vomiting.  Neurological: Negative for dizziness and headaches.  Psychiatric/Behavioral: Negative for agitation and behavioral problems.       Objective:   Physical Exam  Patient had virtual visit Appears to be in no distress Atraumatic Neuro able to relate and oriented No apparent resp distress Color normal       Assessment & Plan:  Adult ADD 3 scripts were given he will call us later and we will do a fourth prescription with a follow-up Allergies continue Dymista and allergy eyedrops follow-up if progressive troubles 15 minutes was spent with patient today discussing healthcare issues which they came.  More than 50% of this visit-total duration of visit-was spent in counseling and coordination of care.  Please see diagnosis regarding the focus of this coordination and care

## 2018-10-15 ENCOUNTER — Telehealth: Payer: Self-pay | Admitting: Family Medicine

## 2018-10-15 NOTE — Telephone Encounter (Signed)
1.  Let the patient know it was benign both brand name and generic number #2- the medication can be prescribed as separate ingredients Flonase 2 sprays each nostril daily, 1 bottle, 5 refills Astelin 2 sprays each nare twice daily, 1 bottle, 5 refills  If he is okay with using the separate ingredients-which is essentially the same stopped-this could be sent in

## 2018-10-15 NOTE — Telephone Encounter (Signed)
Fax from [facility] stating that Dymista 137-50 mcg/act suspension has been denied. Fax states that the medication is not on the formulary and is approved when two alternative meds on the members formulary have been tried and did not work.  (PA has been completed for both name brand and generic. )

## 2018-10-16 MED ORDER — AZELASTINE HCL 0.1 % NA SOLN: NASAL | 6 refills | Status: DC

## 2018-10-16 MED ORDER — FLUTICASONE PROPIONATE 50 MCG/ACT NA SUSP: NASAL | 6 refills | Status: DC

## 2018-10-16 NOTE — Telephone Encounter (Signed)
Patient is aware the two medications were sent in to the requested pharmacy.

## 2018-10-16 NOTE — Addendum Note (Signed)
Addended by: Meredith Leeds on: 10/16/2018 02:39 PM   Modules accepted: Orders

## 2018-10-16 NOTE — Telephone Encounter (Signed)
Patient states he does not mind using the two different medications as long as they will be covered by insurance. He would like for these to be sent to [facility].

## 2018-10-16 NOTE — Telephone Encounter (Signed)
Please go ahead and send them into [facility]  Flonase 2 sprays each nare daily, 1 canister, 6 refills Astelin 2 sprays each nostril twice daily, will 1 canister, 6 refills

## 2018-10-24 ENCOUNTER — Encounter: Payer: Self-pay | Admitting: Family Medicine

## 2018-11-07 ENCOUNTER — Other Ambulatory Visit: Payer: Self-pay | Admitting: Family Medicine

## 2018-11-09 ENCOUNTER — Other Ambulatory Visit: Payer: Self-pay | Admitting: Family Medicine

## 2018-11-09 ENCOUNTER — Encounter: Payer: Self-pay | Admitting: Family Medicine

## 2018-11-09 MED ORDER — AMPHETAMINE-DEXTROAMPHET ER 30 MG PO CP24: 30.0000 mg | ORAL_CAPSULE | Freq: Every day | ORAL | 0 refills | Status: DC

## 2018-11-09 MED ORDER — AMPHETAMINE-DEXTROAMPHET ER 30 MG PO CP24: 30.0000 mg | ORAL_CAPSULE | ORAL | 0 refills | Status: DC

## 2018-11-09 NOTE — Telephone Encounter (Signed)
I sent this patient's medicines him back when we did his med visit So therefore I sent all 3 scripts again so this 1 can be sent as a decline/duplicate thank you

## 2018-11-19 DIAGNOSIS — M3501 Sicca syndrome with keratoconjunctivitis: Secondary | ICD-10-CM | POA: Diagnosis not present

## 2019-02-03 DIAGNOSIS — Z1159 Encounter for screening for other viral diseases: Secondary | ICD-10-CM | POA: Diagnosis not present

## 2019-03-25 ENCOUNTER — Other Ambulatory Visit: Payer: Self-pay

## 2019-03-25 ENCOUNTER — Ambulatory Visit (INDEPENDENT_AMBULATORY_CARE_PROVIDER_SITE_OTHER): Payer: BC Managed Care – PPO | Admitting: Family Medicine

## 2019-03-25 DIAGNOSIS — J019 Acute sinusitis, unspecified: Secondary | ICD-10-CM

## 2019-03-25 DIAGNOSIS — Z20828 Contact with and (suspected) exposure to other viral communicable diseases: Secondary | ICD-10-CM

## 2019-03-25 DIAGNOSIS — Z20822 Contact with and (suspected) exposure to covid-19: Secondary | ICD-10-CM

## 2019-03-25 MED ORDER — AMPHETAMINE-DEXTROAMPHET ER 30 MG PO CP24: 30.0000 mg | ORAL_CAPSULE | ORAL | 0 refills | Status: DC

## 2019-03-25 MED ORDER — CEFPROZIL 500 MG PO TABS: 500.0000 mg | ORAL_TABLET | Freq: Two times a day (BID) | ORAL | 0 refills | Status: DC

## 2019-03-25 NOTE — Progress Notes (Signed)
   Subjective:    Patient ID: Steve Lucas, male    DOB: May 08, 1991, 28 y.o.   MRN: [id number]  Sore Throat  This is a new problem. The current episode started in the past 7 days. Associated symptoms include congestion. Associated symptoms comments: White spot in mouth. Treatments tried: nasal rinse and spray.      Review of Systems  HENT: Positive for congestion.    Virtual Visit via Video Note  I connected with Steve Lucas on 03/25/19 at  3:30 PM EST by a video enabled telemedicine application and verified that I am speaking with the correct person using two identifiers.  Location: Patient: home Provider: office   I discussed the limitations of evaluation and management by telemedicine and the availability of in person appointments. The patient expressed understanding and agreed to proceed.  History of Present Illness:    Observations/Objective:   Assessment and Plan:   Follow Up Instructions:    I discussed the assessment and treatment plan with the patient. The patient was provided an opportunity to ask questions and all were answered. The patient agreed with the plan and demonstrated an understanding of the instructions.   The patient was advised to call back or seek an in-person evaluation if the symptoms worsen or if the condition fails to improve as anticipated.  I provided 18 minutes of non-face-to-face time during this encounter.  Patient has had lots of congestion blowing over time.  Now frontal headache.  Pressure.  Sore throat.  A bit of a cough.  Energy level somewhat diminished.  No fever.      Objective:   Physical Exam  Virtual      Assessment & Plan:  Impression probable sinusitis though cannot rule out COVID-19 secondary to symptomatology.  Recommend COVID-19 testing.  Patient will pursue at his own priv private pharmacy brings appropriate antibiotics prescribed let us know once he knows results

## 2019-03-26 ENCOUNTER — Encounter: Payer: Self-pay | Admitting: Family Medicine

## 2019-03-28 DIAGNOSIS — Z20828 Contact with and (suspected) exposure to other viral communicable diseases: Secondary | ICD-10-CM | POA: Diagnosis not present

## 2019-04-17 DIAGNOSIS — Z20828 Contact with and (suspected) exposure to other viral communicable diseases: Secondary | ICD-10-CM | POA: Diagnosis not present

## 2019-05-08 ENCOUNTER — Ambulatory Visit (INDEPENDENT_AMBULATORY_CARE_PROVIDER_SITE_OTHER): Payer: BC Managed Care – PPO | Admitting: Family Medicine

## 2019-05-08 ENCOUNTER — Other Ambulatory Visit: Payer: Self-pay

## 2019-05-08 DIAGNOSIS — F988 Other specified behavioral and emotional disorders with onset usually occurring in childhood and adolescence: Secondary | ICD-10-CM

## 2019-05-08 NOTE — Progress Notes (Signed)
   Subjective:    Patient ID: Steve Lucas, male    DOB: 07/28/1990, 28 y.o.   MRN: [id number]  HPI  Patient calls for a follow up on ADHD. Patient states he is doing well with no problems or concerns. He is doing schooling and work plus he is engaged plans to get married this summer possibly moved to [place] he relates the medicine does fairly well for him but he would like to see at [facility] better he contemplates going up on a higher dose we did discuss 40 mg using 2 capsules of the 20 mg currently he would like to stick with the current dose of 30 and states he is not having any side effects although he still has some difficulties with focusing Virtual Visit via Video Note  I connected with Clarice Pole on 05/08/19 at  8:30 AM EST by a video enabled telemedicine application and verified that I am speaking with the correct person using two identifiers.  Location: Patient: home Provider: office   I discussed the limitations of evaluation and management by telemedicine and the availability of in person appointments. The patient expressed understanding and agreed to proceed.  History of Present Illness:    Observations/Objective:   Assessment and Plan:   Follow Up Instructions:    I discussed the assessment and treatment plan with the patient. The patient was provided an opportunity to ask questions and all were answered. The patient agreed with the plan and demonstrated an understanding of the instructions.   The patient was advised to call back or seek an in-person evaluation if the symptoms worsen or if the condition fails to improve as anticipated.  I provided 15 minutes of non-face-to-face time during this encounter.      Review of Systems  Constitutional: Negative for activity change, appetite change and fatigue.  HENT: Negative for congestion and rhinorrhea.   Respiratory: Negative for cough and shortness of breath.   Cardiovascular: Negative for chest pain  and leg swelling.  Gastrointestinal: Negative for abdominal pain, nausea and vomiting.  Neurological: Negative for dizziness and headaches.  Psychiatric/Behavioral: Negative for agitation and behavioral problems.       Objective:   Physical Exam  Patient had virtual visit Appears to be in no distress Atraumatic Neuro able to relate and oriented No apparent resp distress Color normal       Assessment & Plan:  The patient was seen today as part of the visit regarding ADD. Medications were reviewed with the patient as well as compliance. Side effects were checked for. Discussion regarding effectiveness was held. Prescriptions were written. Patient reminded to follow-up in approximately 3 months. Behavioral and study issues were addressed.  Plans to [facility] law with drug registry was checked and verified while present with the patient.

## 2019-05-10 MED ORDER — AMPHETAMINE-DEXTROAMPHET ER 30 MG PO CP24: 30.0000 mg | ORAL_CAPSULE | ORAL | 0 refills | Status: DC

## 2019-05-10 MED ORDER — AMPHETAMINE-DEXTROAMPHET ER 30 MG PO CP24: 30.0000 mg | ORAL_CAPSULE | Freq: Every day | ORAL | 0 refills | Status: DC

## 2019-05-18 DIAGNOSIS — Z20828 Contact with and (suspected) exposure to other viral communicable diseases: Secondary | ICD-10-CM | POA: Diagnosis not present

## 2019-05-23 DIAGNOSIS — Z20828 Contact with and (suspected) exposure to other viral communicable diseases: Secondary | ICD-10-CM | POA: Diagnosis not present

## 2019-05-24 DIAGNOSIS — Z20828 Contact with and (suspected) exposure to other viral communicable diseases: Secondary | ICD-10-CM | POA: Diagnosis not present

## 2019-05-28 DIAGNOSIS — Z20828 Contact with and (suspected) exposure to other viral communicable diseases: Secondary | ICD-10-CM | POA: Diagnosis not present

## 2019-05-29 DIAGNOSIS — Z20828 Contact with and (suspected) exposure to other viral communicable diseases: Secondary | ICD-10-CM | POA: Diagnosis not present

## 2019-07-21 ENCOUNTER — Ambulatory Visit: Payer: Self-pay

## 2019-08-21 ENCOUNTER — Ambulatory Visit (INDEPENDENT_AMBULATORY_CARE_PROVIDER_SITE_OTHER): Payer: 59 | Admitting: Family Medicine

## 2019-08-21 ENCOUNTER — Other Ambulatory Visit: Payer: Self-pay

## 2019-08-21 DIAGNOSIS — F988 Other specified behavioral and emotional disorders with onset usually occurring in childhood and adolescence: Secondary | ICD-10-CM

## 2019-08-21 MED ORDER — AMPHETAMINE-DEXTROAMPHET ER 20 MG PO CP24: ORAL_CAPSULE | ORAL | 0 refills | Status: DC

## 2019-08-21 MED ORDER — AMPHETAMINE-DEXTROAMPHET ER 20 MG PO CP24: ORAL_CAPSULE | ORAL | 0 refills | Status: AC

## 2019-08-21 NOTE — Progress Notes (Addendum)
   Subjective:    Patient ID: Steve Lucas, male    DOB: 12-18-1990, 29 y.o.   MRN: [id number]  HPI Patient was seen today for ADD checkup.  This patient does have ADD.  Patient takes medications for this.  If this does help control overall symptoms.  Please see below. -weight, vital signs reviewed.  The following items were covered. -Compliance with medication : Adderall XR 30 mg each morning   -Problems with completing homework, paying attention/taking good notes in school: having some issues in class  -grades: most classes are OK. Pt has tendency; makes Public relations account executive semester. Pt starts off with low grades and then has to set discipline for himself to bring grades up  - Eating patterns : eating well  -sleeping: no issues with sleep  -Additional issues or questions: pt would like to increase dose   Virtual Visit via Video Note  I connected with Steve Lucas on 08/21/19 at 11:00 AM EDT by a video enabled telemedicine application and verified that I am speaking with the correct person using two identifiers.  Location: Patient: home Provider: office   I discussed the limitations of evaluation and management by telemedicine and the availability of in person appointments. The patient expressed understanding and agreed to proceed.  History of Present Illness:    Observations/Objective:   Assessment and Plan:   Follow Up Instructions:    I discussed the assessment and treatment plan with the patient. The patient was provided an opportunity to ask questions and all were answered. The patient agreed with the plan and demonstrated an understanding of the instructions.   The patient was advised to call back or seek an in-person evaluation if the symptoms worsen or if the condition fails to improve as anticipated.  I provided 15 minutes of non-face-to-face time during this encounter.         Review of Systems  Constitutional: Negative for activity change,  appetite change and fatigue.  HENT: Negative for congestion and rhinorrhea.   Respiratory: Negative for cough and shortness of breath.   Cardiovascular: Negative for chest pain and leg swelling.  Gastrointestinal: Negative for abdominal pain, nausea and vomiting.  Neurological: Negative for dizziness and headaches.  Psychiatric/Behavioral: Negative for agitation and behavioral problems.       Objective:   Physical Exam   Patient had virtual visit Appears to be in no distress Atraumatic Neuro able to relate and oriented No apparent resp distress Color normal      Assessment & Plan:  ADD He states he still having a difficult time focusing and following through on things.  He is interested in upping the dose.  We did discuss how there is potential how increasing the dose could cause some issues regarding sleep and appetite Nonetheless we will go ahead bump up the dose to 20 mg extended release take 2 each morning for a total of 40 mg Patient will give [place] an update within 2 to 3 weeks how this is doing  He states that this does not do well enough he would like to try Vyvanse  It should be noted that this patient does have chronic allergic rhinitis.  He is tried [employer] as well as over-the-counter Nasacort without decent success.  Therefore we utilize Dymista for him and he has done well with this.

## 2019-08-25 ENCOUNTER — Encounter: Payer: Self-pay | Admitting: Family Medicine

## 2019-08-27 ENCOUNTER — Encounter: Payer: Self-pay | Admitting: *Deleted

## 2019-09-22 ENCOUNTER — Ambulatory Visit: Payer: 59 | Admitting: Family Medicine

## 2019-10-06 ENCOUNTER — Encounter: Payer: Self-pay | Admitting: Family Medicine

## 2019-10-06 ENCOUNTER — Other Ambulatory Visit: Payer: Self-pay

## 2019-10-06 ENCOUNTER — Ambulatory Visit (INDEPENDENT_AMBULATORY_CARE_PROVIDER_SITE_OTHER): Payer: 59 | Admitting: Family Medicine

## 2019-10-06 VITALS — BP 120/68 | Temp 97.7°F | Wt 144.2 lb

## 2019-10-06 DIAGNOSIS — H6981 Other specified disorders of Eustachian tube, right ear: Secondary | ICD-10-CM

## 2019-10-06 MED ORDER — AMOXICILLIN 500 MG PO CAPS: 500.0000 mg | ORAL_CAPSULE | Freq: Three times a day (TID) | ORAL | 0 refills | Status: DC

## 2019-10-06 NOTE — Progress Notes (Signed)
   Subjective:    Patient ID: Steve Lucas, male    DOB: 07/04/1990, 29 y.o.   MRN: [id number]  HPI Patient comes in today with complaints of ear discomfort and clogged sensation on and off for several weeks.  Patient states he is feeling it mostly in his right ear but a slight discomfort in the left. It usually lasts a few days and then goes away.  Patient has had recent nasal congestion.  Patient also under quite a bit of stress.  Newly graduated.  Moving to [facility].  About ready to get married under somewhat stressful circumstances Review of Systems No fever no headache no chest pain    Objective:   Physical Exam   Alert oriented no acute distress.  Lungs clear.  Heart rate and rhythm.  Right TM external canal perfect tympanic membrane perfect left TM and external canal also completely normal     Assessment & Plan:  Impression probable eustachian tube dysfunction amoxicillin 500 3 times daily 10 days.  Resume steroid nasal spray for congestion/allergy element.  Expect slow resolution.  Eustachian tube dysfunction

## 2019-10-06 NOTE — Patient Instructions (Signed)
Eustachian Tube Dysfunction ° °Eustachian tube dysfunction refers to a condition in which a blockage develops in the narrow passage that connects the middle ear to the back of the nose (eustachian tube). The eustachian tube regulates air pressure in the middle ear by letting air move between the ear and nose. It also helps to drain fluid from the middle ear space. °Eustachian tube dysfunction can affect one or both ears. When the eustachian tube does not function properly, air pressure, fluid, or both can build up in the middle ear. °What are the causes? °This condition occurs when the eustachian tube becomes blocked or cannot open normally. Common causes of this condition include: °· Ear infections. °· Colds and other infections that affect the nose, mouth, and throat (upper respiratory tract). °· Allergies. °· Irritation from cigarette smoke. °· Irritation from stomach acid coming up into the esophagus (gastroesophageal reflux). The esophagus is the tube that carries food from the mouth to the stomach. °· Sudden changes in air pressure, such as from descending in an airplane or scuba diving. °· Abnormal growths in the nose or throat, such as: °? Growths that line the nose (nasal polyps). °? Abnormal growth of cells (tumors). °? Enlarged tissue at the back of the throat (adenoids). °What increases the risk? °You are more likely to develop this condition if: °· You smoke. °· You are overweight. °· You are a child who has: °? Certain birth defects of the mouth, such as cleft palate. °? Large tonsils or adenoids. °What are the signs or symptoms? °Common symptoms of this condition include: °· A feeling of fullness in the ear. °· Ear pain. °· Clicking or popping noises in the ear. °· Ringing in the ear. °· Hearing loss. °· Loss of balance. °· Dizziness. °Symptoms may get worse when the air pressure around you changes, such as when you travel to an area of high elevation, fly on an airplane, or go scuba diving. °How is  this diagnosed? °This condition may be diagnosed based on: °· Your symptoms. °· A physical exam of your ears, nose, and throat. °· Tests, such as those that measure: °? The movement of your eardrum (tympanogram). °? Your hearing (audiometry). °How is this treated? °Treatment depends on the cause and severity of your condition. °· In mild cases, you may relieve your symptoms by moving air into your ears. This is called "popping the ears." °· In more severe cases, or if you have symptoms of fluid in your ears, treatment may include: °? Medicines to relieve congestion (decongestants). °? Medicines that treat allergies (antihistamines). °? Nasal sprays or ear drops that contain medicines that reduce swelling (steroids). °? A procedure to drain the fluid in your eardrum (myringotomy). In this procedure, a small tube is placed in the eardrum to: °§ Drain the fluid. °§ Restore the air in the middle ear space. °? A procedure to insert a balloon device through the nose to inflate the opening of the eustachian tube (balloon dilation). °Follow these instructions at home: °Lifestyle °· Do not do any of the following until your health care provider approves: °? Travel to high altitudes. °? Fly in airplanes. °? Work in a pressurized cabin or room. °? Scuba dive. °· Do not use any products that contain nicotine or tobacco, such as cigarettes and e-cigarettes. If you need help quitting, ask your health care provider. °· Keep your ears dry. Wear fitted earplugs during showering and bathing. Dry your ears completely after. °General instructions °· Take over-the-counter   and prescription medicines only as told by your health care provider. °· Use techniques to help pop your ears as recommended by your health care provider. These may include: °? Chewing gum. °? Yawning. °? Frequent, forceful swallowing. °? Closing your mouth, holding your nose closed, and gently blowing as if you are trying to blow air out of your nose. °· Keep all  follow-up visits as told by your health care provider. This is important. °Contact a health care provider if: °· Your symptoms do not go away after treatment. °· Your symptoms come back after treatment. °· You are unable to pop your ears. °· You have: °? A fever. °? Pain in your ear. °? Pain in your head or neck. °? Fluid draining from your ear. °· Your hearing suddenly changes. °· You become very dizzy. °· You lose your balance. °Summary °· Eustachian tube dysfunction refers to a condition in which a blockage develops in the eustachian tube. °· It can be caused by ear infections, allergies, inhaled irritants, or abnormal growths in the nose or throat. °· Symptoms include ear pain, hearing loss, or ringing in the ears. °· Mild cases are treated with maneuvers to unblock the ears, such as yawning or ear popping. °· Severe cases are treated with medicines. Surgery may also be done (rare). °This information is not intended to replace advice given to you by your health care provider. Make sure you discuss any questions you have with your health care provider. °Document Revised: 08/21/2017 Document Reviewed: 08/21/2017 °Elsevier Patient Education © 2020 [employer] ° °

## 2019-10-15 ENCOUNTER — Encounter: Payer: Self-pay | Admitting: Nurse Practitioner

## 2019-10-15 ENCOUNTER — Ambulatory Visit (INDEPENDENT_AMBULATORY_CARE_PROVIDER_SITE_OTHER): Payer: 59 | Admitting: Nurse Practitioner

## 2019-10-15 ENCOUNTER — Other Ambulatory Visit: Payer: Self-pay

## 2019-10-15 VITALS — BP 110/72 | Temp 97.6°F | Ht 68.0 in | Wt 143.6 lb

## 2019-10-15 DIAGNOSIS — L729 Follicular cyst of the skin and subcutaneous tissue, unspecified: Secondary | ICD-10-CM | POA: Diagnosis not present

## 2019-10-15 NOTE — Progress Notes (Signed)
   Subjective:    Patient ID: Steve Lucas, male    DOB: 1990/11/23, 29 y.o.   MRN: [id number]  HPI  Patient arrives with bump on back for 2 weeks. States he noticed it accidentally.  Nontender.  Asymptomatic.  Has gotten slightly larger over the past couple of weeks.  Review of Systems     Objective:   Physical Exam NAD.  Alert, oriented.  A superficial soft fluctuant cystic lesion noted in the left mid back area near the midclavicular line.  Very fluctuant.  Nontender.  Approximately 3 to 4 cm x 1 cm in diameter.       Assessment & Plan:  Skin cyst  No indication for urgent referral at this time.  Patient defers surgical consultation at this point.  Recommend referral if area becomes larger or tender.  Continue to monitor at this point.

## 2019-10-16 ENCOUNTER — Encounter: Payer: Self-pay | Admitting: Nurse Practitioner

## 2019-11-14 ENCOUNTER — Other Ambulatory Visit: Payer: Self-pay | Admitting: *Deleted

## 2019-11-14 ENCOUNTER — Other Ambulatory Visit: Payer: Self-pay | Admitting: Family Medicine

## 2019-11-14 ENCOUNTER — Telehealth: Payer: Self-pay | Admitting: Family Medicine

## 2019-11-14 ENCOUNTER — Encounter: Payer: Self-pay | Admitting: Family Medicine

## 2019-11-14 MED ORDER — AMPHETAMINE-DEXTROAMPHET ER 20 MG PO CP24: ORAL_CAPSULE | ORAL | 0 refills | Status: AC

## 2019-11-14 MED ORDER — FLUTICASONE PROPIONATE 50 MCG/ACT NA SUSP: NASAL | 6 refills | Status: DC

## 2019-11-14 MED ORDER — AZELASTINE-FLUTICASONE 137-50 MCG/ACT NA SUSP: NASAL | 5 refills | Status: DC

## 2019-11-14 NOTE — Telephone Encounter (Signed)
Hi, the pharmacy is [facility].  Address: [address] [place], [place], [address].  Phone: [phone]   Yes, the pharmacist informed me that [place] doesn't transfer Adderall out of state and that Dr. Lorin Picket needs to be prescribed it for me here.    Thanks!

## 2019-11-14 NOTE — Telephone Encounter (Signed)
Pt moved to [place] needs his meds sent to amphetamine-dextroamphetamine (ADDERALL XR) 20 MG 24 ,Azelastine-Fluticasone (DYMISTA) 137-50  fluticasone (FLONASE) 50 MCG/ACT nasal spray   Pt was calling back with the name of the pharmacy when I tried to call pt back mail box is full

## 2019-11-14 NOTE — Telephone Encounter (Signed)
Sent MyChart message to inquire about pharmacy.

## 2019-11-14 NOTE — Telephone Encounter (Signed)
Medication was sent to the patient as requested

## 2019-11-14 NOTE — Progress Notes (Signed)
Error

## 2019-11-18 NOTE — Telephone Encounter (Signed)
Patient notified and verbalized understanding. 

## 2019-12-02 ENCOUNTER — Other Ambulatory Visit: Payer: Self-pay | Admitting: Family Medicine

## 2019-12-02 MED ORDER — FLUTICASONE PROPIONATE 50 MCG/ACT NA SUSP: NASAL | 6 refills | Status: AC

## 2019-12-12 ENCOUNTER — Other Ambulatory Visit: Payer: Self-pay | Admitting: Family Medicine

## 2019-12-12 ENCOUNTER — Encounter: Payer: Self-pay | Admitting: *Deleted

## 2019-12-12 NOTE — Telephone Encounter (Signed)
Message sent on mychart 

## 2019-12-12 NOTE — Telephone Encounter (Signed)
Patient recently moved to [facility] Please clarify with patient-using my chart if need be-does this prescription need to be sent to [facility] pharmacy or local pharmacy?

## 2019-12-16 ENCOUNTER — Other Ambulatory Visit: Payer: Self-pay | Admitting: Family Medicine

## 2019-12-21 ENCOUNTER — Encounter: Payer: Self-pay | Admitting: Family Medicine

## 2019-12-21 DIAGNOSIS — J309 Allergic rhinitis, unspecified: Secondary | ICD-10-CM

## 2019-12-21 HISTORY — DX: Allergic rhinitis, unspecified: J30.9

## 2020-01-01 ENCOUNTER — Telehealth: Payer: Self-pay | Admitting: Family Medicine

## 2020-01-01 NOTE — Telephone Encounter (Signed)
Fax from [employer] stating that Azelastin Nasal spray has been approved from 12/27/19-12/26/20. My chart message sent to patient informing him of approval.

## 2020-04-28 IMAGING — DX DG TIBIA/FIBULA 2V*L*
4 series · 4 of 4 positions shown · non-contrast
Comparison: None.

CLINICAL DATA: Dog bite to bilateral lower legs

EXAM:
LEFT TIBIA AND FIBULA - 2 VIEW

[tibia ap (1 of 2)]
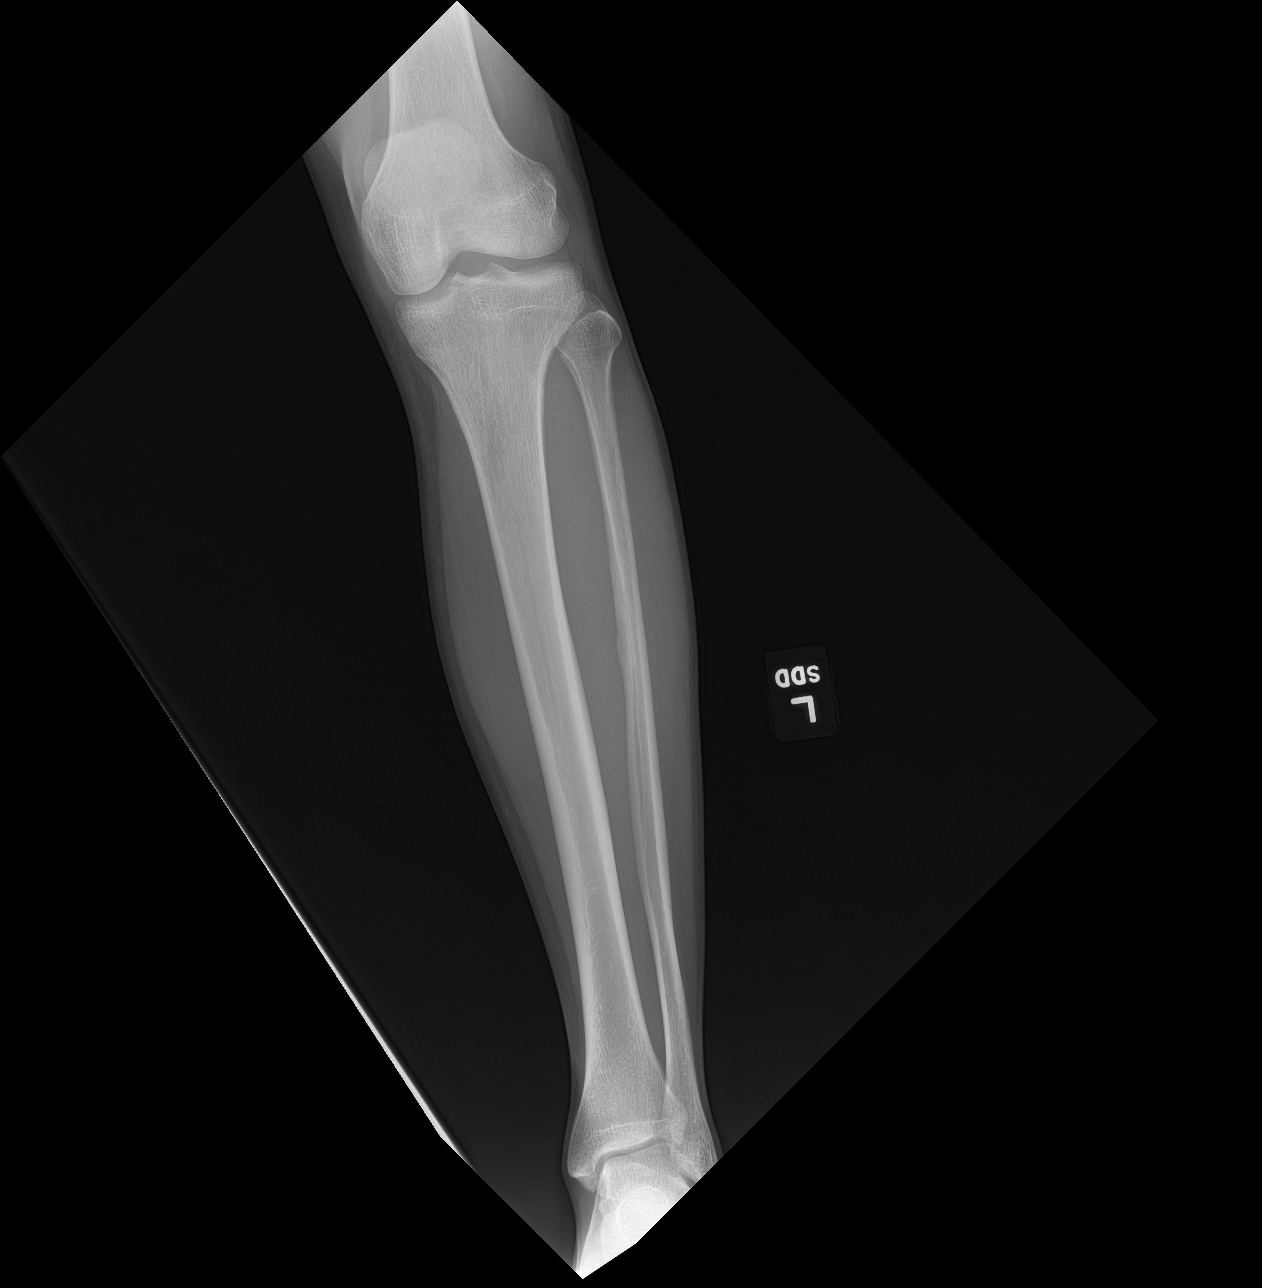

[tibia ap (2 of 2)]
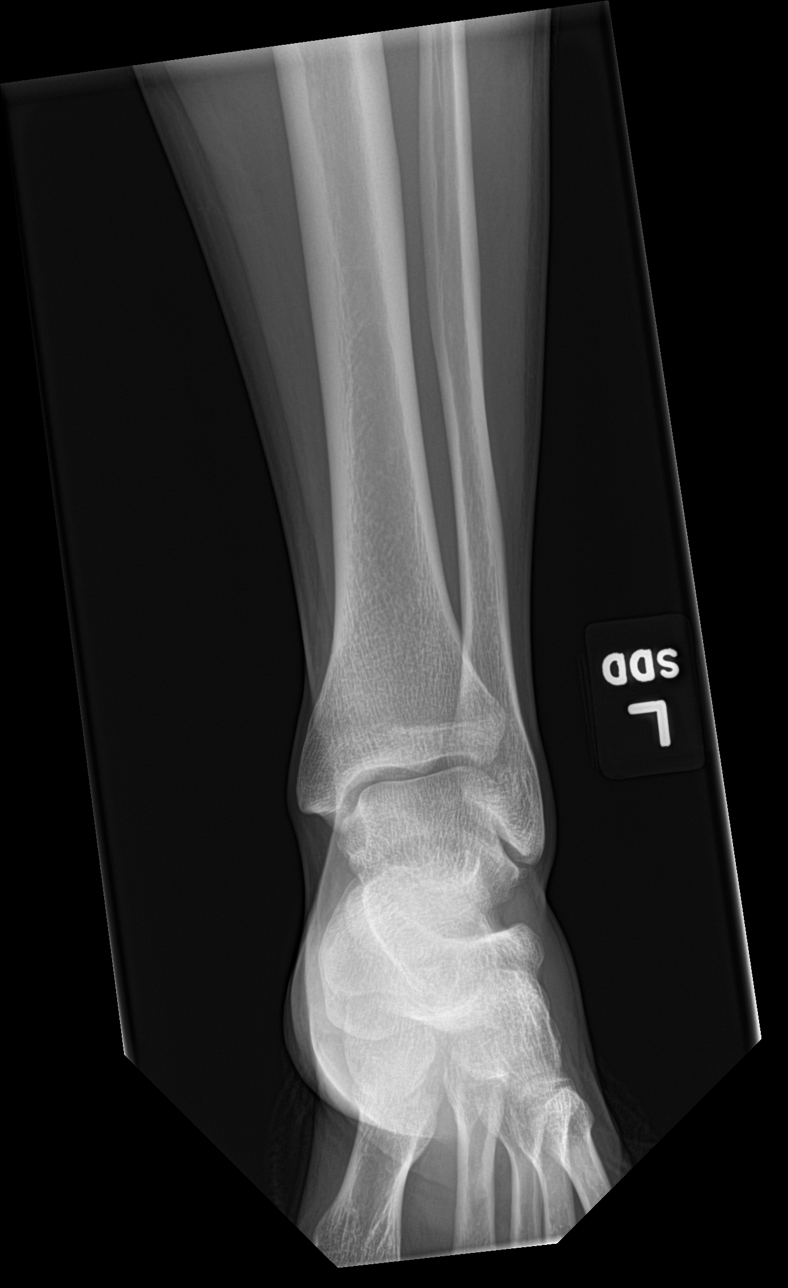

[tibia lat (1 of 2)]
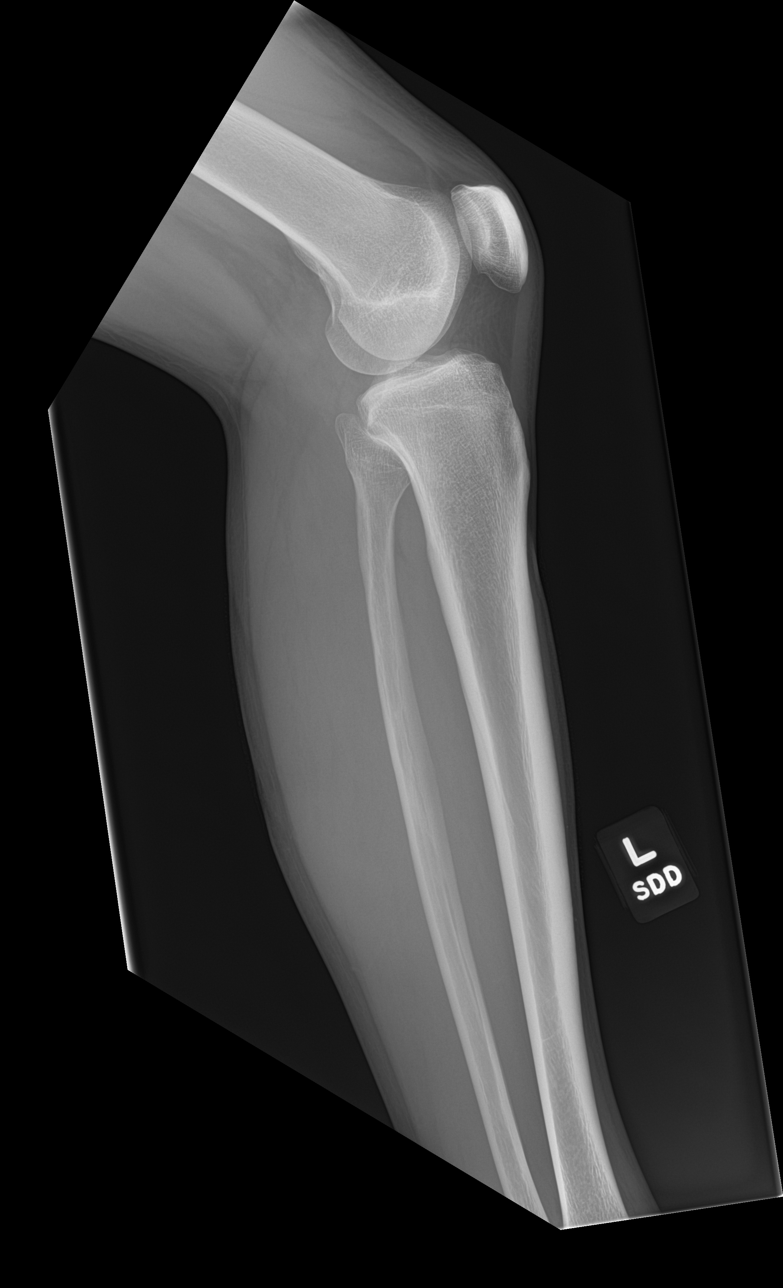

[tibia lat (2 of 2)]
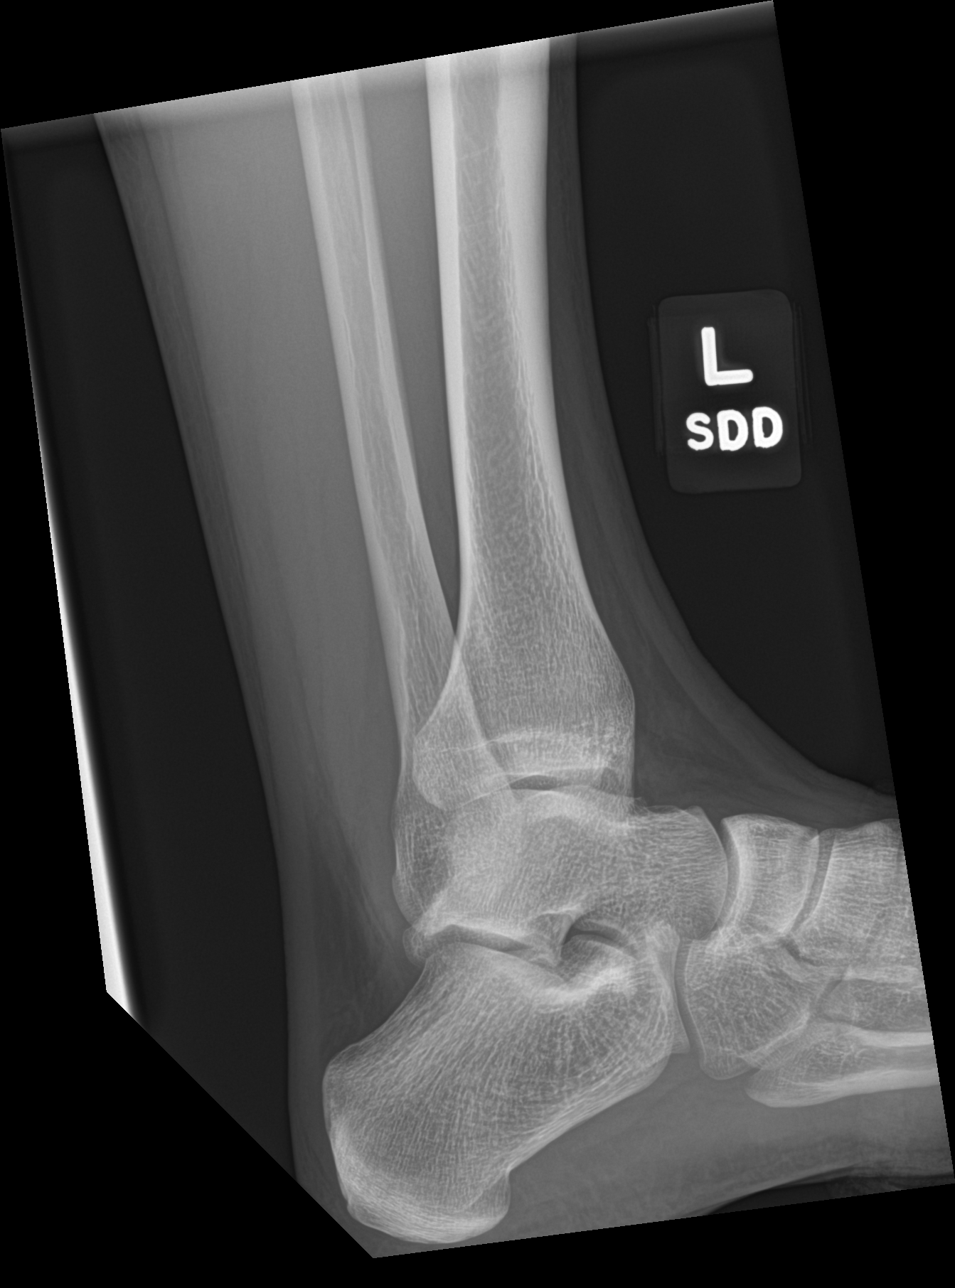

[4 of 4 positions shown; findings below may reference images not displayed]

FINDINGS: No fracture or dislocation is seen.

The joint spaces are preserved.

The visualized soft tissues are unremarkable.

No radiopaque foreign body is seen.
IMPRESSION: Negative.
